# Patient Record
Sex: Male | Born: 1970 | Race: Asian | Hispanic: No | Marital: Married | Smoking: Never smoker
Health system: Southern US, Community
[De-identification: ages and names within clinical notes are randomized; demographics above are authoritative.]

## PROBLEM LIST (undated history)

## (undated) DIAGNOSIS — T7840XA Allergy, unspecified, initial encounter: Secondary | ICD-10-CM

## (undated) HISTORY — DX: Allergy, unspecified, initial encounter: T78.40XA

---

## 2000-10-24 ENCOUNTER — Encounter: Admission: RE | Admit: 2000-10-24 | Discharge: 2000-10-24 | Payer: Self-pay | Admitting: Hematology and Oncology

## 2000-11-03 ENCOUNTER — Encounter: Admission: RE | Admit: 2000-11-03 | Discharge: 2000-11-03 | Payer: Self-pay | Admitting: Internal Medicine

## 2000-11-04 ENCOUNTER — Encounter: Admission: RE | Admit: 2000-11-04 | Discharge: 2000-11-04 | Payer: Self-pay | Admitting: *Deleted

## 2004-03-25 ENCOUNTER — Emergency Department (HOSPITAL_COMMUNITY): Admission: EM | Admit: 2004-03-25 | Discharge: 2004-03-25 | Payer: Self-pay | Admitting: Emergency Medicine

## 2004-10-19 ENCOUNTER — Ambulatory Visit: Payer: Self-pay | Admitting: Gastroenterology

## 2004-10-23 ENCOUNTER — Ambulatory Visit: Payer: Self-pay | Admitting: Gastroenterology

## 2004-10-29 ENCOUNTER — Ambulatory Visit: Payer: Self-pay | Admitting: Gastroenterology

## 2004-11-06 ENCOUNTER — Ambulatory Visit: Payer: Self-pay | Admitting: Gastroenterology

## 2005-07-01 ENCOUNTER — Emergency Department (HOSPITAL_COMMUNITY): Admission: EM | Admit: 2005-07-01 | Discharge: 2005-07-01 | Payer: Self-pay | Admitting: Emergency Medicine

## 2010-11-06 ENCOUNTER — Other Ambulatory Visit: Payer: Self-pay | Admitting: Internal Medicine

## 2010-11-06 DIAGNOSIS — R700 Elevated erythrocyte sedimentation rate: Secondary | ICD-10-CM

## 2010-11-07 ENCOUNTER — Other Ambulatory Visit (HOSPITAL_COMMUNITY): Payer: Self-pay

## 2010-11-07 ENCOUNTER — Other Ambulatory Visit: Payer: Self-pay | Admitting: Internal Medicine

## 2010-11-07 ENCOUNTER — Ambulatory Visit (HOSPITAL_COMMUNITY)
Admission: RE | Admit: 2010-11-07 | Discharge: 2010-11-07 | Disposition: A | Discharge status: Home / Self Care | Payer: Medicaid Other | Source: Ambulatory Visit | Attending: Internal Medicine | Admitting: Internal Medicine

## 2010-11-07 DIAGNOSIS — R700 Elevated erythrocyte sedimentation rate: Secondary | ICD-10-CM

## 2010-11-07 DIAGNOSIS — R51 Headache: Secondary | ICD-10-CM | POA: Insufficient documentation

## 2010-11-07 DIAGNOSIS — J329 Chronic sinusitis, unspecified: Secondary | ICD-10-CM | POA: Insufficient documentation

## 2011-12-31 ENCOUNTER — Ambulatory Visit (INDEPENDENT_AMBULATORY_CARE_PROVIDER_SITE_OTHER): Payer: Self-pay | Admitting: Physician Assistant

## 2011-12-31 VITALS — BP 107/72 | HR 89 | Temp 99.8°F | Resp 18 | Ht 67.0 in | Wt 149.0 lb

## 2011-12-31 DIAGNOSIS — R05 Cough: Secondary | ICD-10-CM

## 2011-12-31 DIAGNOSIS — R509 Fever, unspecified: Secondary | ICD-10-CM

## 2011-12-31 DIAGNOSIS — J111 Influenza due to unidentified influenza virus with other respiratory manifestations: Secondary | ICD-10-CM

## 2011-12-31 LAB — POCT CBC
Granulocyte percent: 57.3 %G (ref 37–80)
MID (cbc): 0.5 (ref 0–0.9)
POC Granulocyte: 2 (ref 2–6.9)
POC LYMPH PERCENT: 29.2 %L (ref 10–50)
Platelet Count, POC: 127 10*3/uL — AB (ref 142–424)
RBC: 4.69 M/uL (ref 4.69–6.13)
RDW, POC: 13.8 %

## 2011-12-31 LAB — POCT INFLUENZA A/B: Influenza A, POC: NEGATIVE

## 2011-12-31 MED ORDER — HYDROCODONE-HOMATROPINE 5-1.5 MG/5ML PO SYRP: ORAL_SOLUTION | ORAL | Status: AC

## 2011-12-31 NOTE — Progress Notes (Signed)
Patient ID: Marc Wade MRN: [id number], DOB: [birth year], 41 y.o. Date of Encounter: 12/31/2011, 10:24 AM  Primary Physician: No primary provider on file.  Chief Complaint:  Chief Complaint  Patient presents with  . Cough    x 5 days; chest pain with cough  . Generalized Body Aches  . Fever    HPI: 41 y.o. year old male presents with 5 day history of nasal congestion, post nasal drip, sore throat, sinus pressure, and cough. Subjective fever and chills. Nasal congestion thick and green/yellow. Cough is mildly productive of yellow sputum and worse in the morning. He is sore from continued coughing. No chest pain, SOB, wheezing, or edema. Ears feel full, leading to sensation of muffled hearing. Has tried OTC cold preps without success. No GI complaints. Appetite normal. No influenza vaccine this year. No known sick contacts.   No recent antibiotics, or recent travels.   No leg trauma, sedentary periods, h/o cancer, or tobacco use.  No past medical history on file.   Home Meds: Prior to Admission medications   Not on File    Allergies: No Known Allergies  History   Social History  . Marital Status: Married    Spouse Name: N/A    Number of Children: N/A  . Years of Education: N/A   Occupational History  . Not on file.   Social History Main Topics  . Smoking status: Never Smoker   . Smokeless tobacco: Not on file  . Alcohol Use: Not on file  . Drug Use: Not on file  . Sexually Active: Not on file   Other Topics Concern  . Not on file   Social History Narrative  . No narrative on file     Review of Systems: Constitutional: negative for night sweats or weight changes Cardiovascular: negative for chest pain or palpitations Respiratory: negative for hemoptysis, wheezing, or shortness of breath Abdominal: negative for abdominal pain, nausea, vomiting or diarrhea Dermatological: negative for rash Neurologic: negative for headache   Physical Exam: Blood pressure  107/72, pulse 89, temperature 99.8 F (37.7 C), resp. rate 18, height 5\' 7"  (1.702 m), weight 149 lb (67.586 kg)., Body mass index is 23.34 kg/(m^2). General: Well developed, well nourished, in no acute distress. Head: Normocephalic, atraumatic, eyes without discharge, sclera non-icteric, nares are congested. Bilateral auditory canals clear, TM's are without perforation, pearly grey with reflective cone of light bilaterally. Serous effusion bilaterally behind TM's. Maxillary sinus TTP. Oral cavity moist, dentition normal. Posterior pharynx with post nasal drip and mild erythema. No peritonsillar abscess or tonsillar exudate. Neck: Supple. No thyromegaly. Full ROM. No lymphadenopathy. Lungs: Clear bilaterally to auscultation without wheezes, rales, or rhonchi. Breathing is unlabored.  Heart: RRR with S1 S2. No murmurs, rubs, or gallops appreciated. Chest soreness associated with cough is reproducible to palpation on exam. Msk:  Strength and tone normal for age. Extremities: No clubbing or cyanosis. No edema. Neuro: Alert and oriented X 3. Moves all extremities spontaneously. CNII-XII grossly in tact. Psych:  Responds to questions appropriately with a normal affect.   Labs: Results for orders placed in visit on 12/31/11  POCT INFLUENZA A/B      Component Value Range   Influenza A, POC Negative     Influenza B, POC Positive    POCT CBC      Component Value Range   WBC 3.5 (*) 4.6 - 10.2 (K/uL)   Lymph, poc 1.0  0.6 - 3.4    POC LYMPH PERCENT 29.2  10 - 50 (%L)   MID (cbc) 0.5  0 - 0.9    POC MID % 13.5 (*) 0 - 12 (%M)   POC Granulocyte 2.0  2 - 6.9    Granulocyte percent 57.3  37 - 80 (%G)   RBC 4.69  4.69 - 6.13 (M/uL)   Hemoglobin 12.8 (*) 14.1 - 18.1 (g/dL)   HCT, POC 16.1 (*) 09.6 - 53.7 (%)   MCV 84.9  80 - 97 (fL)   MCH, POC 27.3  27 - 31.2 (pg)   MCHC 32.2  31.8 - 35.4 (g/dL)   RDW, POC 04.5     Platelet Count, POC 127 (*) 142 - 424 (K/uL)   MPV 9.7  0 - 99.8 (fL)     ASSESSMENT AND PLAN:  41 y.o. year old male with influenza B. -Outside of Tamiflu window -Hycodan #4oz 1 tsp po q 4-6 hours prn cough no RF SED -Mucinex -Tylenol/Motrin prn -Rest/fluids -OOW until 01/06/2012 -RTC precautions -RTC 5 days if no improvement  Signed, Eula Listen, PA-C 12/31/2011 10:24 AM

## 2012-02-10 ENCOUNTER — Ambulatory Visit: Payer: Self-pay | Admitting: Family Medicine

## 2012-02-10 VITALS — BP 109/69 | HR 70 | Temp 98.0°F | Resp 14 | Ht 68.0 in | Wt 149.6 lb

## 2012-02-10 DIAGNOSIS — H1045 Other chronic allergic conjunctivitis: Secondary | ICD-10-CM

## 2012-02-10 DIAGNOSIS — H1010 Acute atopic conjunctivitis, unspecified eye: Secondary | ICD-10-CM

## 2012-02-10 MED ORDER — AZELASTINE HCL 0.05 % OP SOLN: 1.0000 [drp] | Freq: Two times a day (BID) | OPHTHALMIC | Status: AC

## 2012-02-10 NOTE — Progress Notes (Signed)
  [facility]:  Office Visit  Chief Complaint:  Chief Complaint  Patient presents with  . Conjunctivitis    both eyes, itchy, burning, watering    HPI: Marc Wade is a 41 y.o. male who complains of  1 week history of allergic conjunctivitis. He works in Set [employer] I am not sure what but there is smoke involved and it gets in his eyes and it starts burning, he does wear goggles which helps some. Today his eyes were red bilaterally so his manager told him to get it checked out. There is a language barrie, but he has  itchy, burning watery eyes. Denies fevers, swelling, pustular discharge, eye pain, HA or photophobia. He has been scratching them and that makes it feel better.  Tried Alka-Selzter without relief. Has tried OTC saline ? eye drops without relief.   History reviewed. No pertinent past medical history. History reviewed. No pertinent past surgical history. History   Social History  . Marital Status: Married    Spouse Name: N/A    Number of Children: N/A  . Years of Education: N/A   Social History Main Topics  . Smoking status: Never Smoker   . Smokeless tobacco: None  . Alcohol Use: None  . Drug Use: None  . Sexually Active: None   Other Topics Concern  . None   Social History Narrative  . None   No family history on file. No Known Allergies Prior to Admission medications   Not on File     ROS: The patient denies fevers, chills, night sweats, unintentional weight loss, chest pain, palpitations, wheezing, dyspnea on exertion, nausea, vomiting, abdominal pain, dysuria, hematuria, melena, numbness, weakness, or tingling.+ red eyes  All other systems have been reviewed and were otherwise negative with the exception of those mentioned in the HPI and as above.    PHYSICAL EXAM: Filed Vitals:   02/10/12 1004  BP: 109/69  Pulse: 70  Temp: 98 F (36.7 C)  Resp: 14   Filed Vitals:   02/10/12 1004  Height: 5\' 8"  (1.727 m)  Weight: 149 lb  9.6 oz (67.858 kg)   Body mass index is 22.75 kg/(m^2).  General: Alert, no acute distress HEENT:  Normocephalic, atraumatic, oropharynx patent. Fundocopic exam grossly nl, PERRLA, EOMI. Chemosis of sclera bilaterally, watery dc. No swelling. Vision test was fair.  Cardiovascular:  Regular rate and rhythm, no rubs murmurs or gallops.  No Carotid bruits, radial pulse intact. No pedal edema.  Respiratory: Clear to auscultation bilaterally.  No wheezes, rales, or rhonchi.  No cyanosis, no use of accessory musculature GI: No organomegaly, abdomen is soft and non-tender, positive bowel sounds.  No masses. Skin: No rashes. Neurologic: Facial musculature symmetric. Psychiatric: Patient is appropriate throughout our interaction. Lymphatic: No cervical lymphadenopathy Musculoskeletal: Gait intact.   LABS:    EKG/XRAY:   Primary read interpreted by Dr. Conley Rolls at [facility].   ASSESSMENT/PLAN: Encounter Diagnosis  Name Primary?  . Allergic conjunctivitis Yes   Fluroscein staining was normal, showed some Ptergymis bilaterally but no corneal abrasion. Rx: antihistamine opthalmic drops and natural tears, wear goggles at work at all time, avoid irritants, cool compresses.  Patient does not have insurance since just started his new job     Rockne Coons, DO 02/11/2012 11:26 AM

## 2012-02-13 ENCOUNTER — Telehealth: Payer: Self-pay

## 2012-02-13 NOTE — Telephone Encounter (Signed)
Dr.Le Pt states that he is not better. I cannot understand the pt so this is as much information I could get for you.

## 2012-02-13 NOTE — Telephone Encounter (Signed)
Yes, RTC 

## 2012-02-13 NOTE — Telephone Encounter (Signed)
Should patient RTC if not better?

## 2012-02-14 ENCOUNTER — Telehealth: Payer: Self-pay

## 2012-02-14 NOTE — Telephone Encounter (Signed)
LMOM to call back

## 2012-02-14 NOTE — Telephone Encounter (Signed)
Message created in error

## 2012-02-14 NOTE — Telephone Encounter (Signed)
Patient called to get message. Told him to recheck. Patient understood.

## 2012-07-27 ENCOUNTER — Ambulatory Visit (INDEPENDENT_AMBULATORY_CARE_PROVIDER_SITE_OTHER): Payer: BC Managed Care – PPO | Admitting: Family Medicine

## 2012-07-27 VITALS — BP 109/68 | HR 75 | Temp 98.1°F | Resp 18 | Ht 67.0 in | Wt 145.8 lb

## 2012-07-27 DIAGNOSIS — R05 Cough: Secondary | ICD-10-CM

## 2012-07-27 DIAGNOSIS — J029 Acute pharyngitis, unspecified: Secondary | ICD-10-CM

## 2012-07-27 DIAGNOSIS — R091 Pleurisy: Secondary | ICD-10-CM

## 2012-07-27 LAB — POCT CBC
HCT, POC: 49.6 % (ref 43.5–53.7)
Hemoglobin: 15.3 g/dL (ref 14.1–18.1)
Lymph, poc: 1.9 (ref 0.6–3.4)
MCH, POC: 27 pg (ref 27–31.2)
MCHC: 30.8 g/dL — AB (ref 31.8–35.4)
MCV: 87.7 fL (ref 80–97)
POC LYMPH PERCENT: 43.4 %L (ref 10–50)
WBC: 4.4 10*3/uL — AB (ref 4.6–10.2)

## 2012-07-27 MED ORDER — BENZONATATE 100 MG PO CAPS: 200.0000 mg | ORAL_CAPSULE | Freq: Three times a day (TID) | ORAL | Status: DC | PRN

## 2012-07-27 MED ORDER — NAPROXEN 500 MG PO TABS: 500.0000 mg | ORAL_TABLET | Freq: Two times a day (BID) | ORAL | Status: DC

## 2012-07-27 NOTE — Progress Notes (Signed)
  Subjective:    Patient ID: Marc Wade, male    DOB: [birth year], 41 y.o.   MRN: [id number]   Chief Complaint  Patient presents with  . Cough    started having a cough on Saturday and now having pains in the chest when coughing  . Sore Throat    HPI  2d prev dev sore throat and yesterday it worsened and to the point where he couldn't go to work.  Dev dry cough as well. No f/c. Slight HA bitemporal, no sinus, ear, eye, nasal cong.  Does feel SHoB and sternal CP with cough, has only tried tylenol otc.    Review of Systems    BP 109/68  Pulse 75  Temp(Src) 98.1 F (36.7 C) (Oral)  Resp 18  Ht 5\' 7"  (1.702 m)  Wt 145 lb 12.8 oz (66.134 kg)  BMI 22.83 kg/m2  SpO2 99% Objective:   Physical Exam         Results for orders placed in visit on 07/27/12  POCT CBC      Component Value Range   WBC 4.4 (*) 4.6 - 10.2 K/uL   Lymph, poc 1.9  0.6 - 3.4   POC LYMPH PERCENT 43.4  10 - 50 %L   MID (cbc) 0.3  0 - 0.9   POC MID % 7.7  0 - 12 %M   POC Granulocyte 2.2  2 - 6.9   Granulocyte percent 48.9  37 - 80 %G   RBC 5.66  4.69 - 6.13 M/uL   Hemoglobin 15.3  14.1 - 18.1 g/dL   HCT, POC 40.9  81.1 - 53.7 %   MCV 87.7  80 - 97 fL   MCH, POC 27.0  27 - 31.2 pg   MCHC 30.8 (*) 31.8 - 35.4 g/dL   RDW, POC 91.4     Platelet Count, POC 199  142 - 424 K/uL   MPV 9.0  0 - 99.8 fL     Assessment & Plan:  1. Pleuritis - cough medicine to suppress cough so does worsen and nsaids. 2. Pharyngitis - suspect viral - treat with nsaids. Wrote out for work x 2d.  Recheck if not feeling better.  Sore throat - Plan: POCT CBC  Cough - Plan: POCT CBC  Pleuritis  Meds ordered this encounter  Medications  . benzonatate (TESSALON PERLES) 100 MG capsule    Sig: Take 2 capsules (200 mg total) by mouth 3 (three) times daily as needed for cough.    Dispense:  30 capsule    Refill:  0  . naproxen (NAPROSYN) 500 MG tablet    Sig: Take 1 tablet (500 mg total) by mouth 2 (two) times daily with a  meal.    Dispense:  30 tablet    Refill:  0

## 2013-03-08 ENCOUNTER — Ambulatory Visit (INDEPENDENT_AMBULATORY_CARE_PROVIDER_SITE_OTHER): Payer: BC Managed Care – PPO | Admitting: Internal Medicine

## 2013-03-08 ENCOUNTER — Ambulatory Visit: Payer: BC Managed Care – PPO

## 2013-03-08 VITALS — BP 100/60 | HR 54 | Temp 98.1°F | Resp 18 | Ht 67.75 in | Wt 148.0 lb

## 2013-03-08 DIAGNOSIS — M25561 Pain in right knee: Secondary | ICD-10-CM

## 2013-03-08 DIAGNOSIS — M25569 Pain in unspecified knee: Secondary | ICD-10-CM

## 2013-03-08 DIAGNOSIS — F524 Premature ejaculation: Secondary | ICD-10-CM

## 2013-03-08 MED ORDER — MELOXICAM 15 MG PO TABS: 15.0000 mg | ORAL_TABLET | Freq: Every day | ORAL | Status: DC

## 2013-03-08 MED ORDER — PAROXETINE HCL 20 MG PO TABS: 20.0000 mg | ORAL_TABLET | ORAL | Status: DC

## 2013-03-08 NOTE — Patient Instructions (Signed)
Premature ejaculation (PE) - Premature ejaculation (PE) is also referred to as rapid or early ejaculation and is defined according to three essential criteria [51]: (1) brief ejaculatory latency; (2) loss of control; and (3) psychological distress in the patient and/or partner. Ejaculatory latency of about a minute or less may qualify a man for the diagnosis, which should include consistent inability to delay or control ejaculation, and marked distress about the condition. All three components should be present to qualify for the diagnosis. Subtypes of the disorder are symptom-based, including lifelong versus acquired, global versus situational PE, and the co-occurrence of other sexual problems, particularly ED [52,53].  About 30 percent of men with PE have concurrent ED, which typically results in early ejaculation without full erection [54,55]. A wide range of severity is seen, with patients ejaculating on or prior to penetration in the most severe cases. Patients sometimes present for infertility concerns. Normative data on average latency of ejaculation in different age and ethnic groups is limited [54,55].  The etiology of the disorder is uncertain or obscure in most cases. Negative conditioning and penile hypersensitivity may be etiological factors in PE, although neither mechanism has received adequate experimental support to date [52,56]. Similarly, a genetic basis for the disorder has been proposed [57,58] but is lacking adequate scientific support. PE is a highly prevalent disorder [54,55]; however, accurate population-based data are not available. Although usually associated with less bother than ED, the disorder may be highly distressing in some instances [59]. It is frequently associated with sexual problems in the partner, particularly anorgasmia or a sexual pain disorder (eg, vaginismus). In such cases, couples or sex therapy approaches may be of particular value. These may be combined with  pharmacotherapy or behavioral therapy for treatment of PE in the man [51-53]. Cultural factors are important in determining the degree of distress associated with the disorder  Treatment starts with paxil 20 mg daily and should start to work in 10-15 days If no success , referral to urology may be next step Sometimes counseling is required Using a condom will delay ejaculation as well

## 2013-03-08 NOTE — Progress Notes (Signed)
Subjective:    Patient ID: Marc Wade, male    DOB: 06-04-1971, 42 y.o.   MRN: [id number]  HPI complaining of knee pain for about 4 weeks with no known injury No swelling or redness Hurts to walk at times  Also complaining of premature ejaculation Married for 5-7 years This has. been a problem since the start although was better at first His wife has been complaining He is worried about loss of infection He has no other genitourinary symptoms He denies depression and anxiety    Review of Systems No weight loss or night sweats No underlying cardiopulmonary conditions     Objective:   Physical Exam BP 100/60  Pulse 54  Temp(Src) 98.1 F (36.7 C)  Resp 18  Ht 5' 7.75" (1.721 m)  Wt 148 lb (67.132 kg)  BMI 22.67 kg/m2  SpO2 97% Right knee exam is fully within normal limits except for mild tenderness in the popliteal area      [facility] reading (PRIMARY) by  Dr.Doolitle=NAD   Assessment & Plan:  Pain, knee, right - Plan: DG Knee 1-2 Views Right  Premature ejaculation  Meds ordered this encounter  Medications  . PARoxetine (PAXIL) 20 MG tablet    Sig: Take 1 tablet (20 mg total) by mouth every morning.    Dispense:  30 tablet    Refill:  5  . meloxicam (MOBIC) 15 MG tablet    Sig: Take 1 tablet (15 mg total) by mouth daily. For knee pain    Dispense:  30 tablet    Refill:  0   Patient Instructions  Premature ejaculation (PE) - Premature ejaculation (PE) is also referred to as rapid or early ejaculation and is defined according to three essential criteria [51]: (1) brief ejaculatory latency; (2) loss of control; and (3) psychological distress in the patient and/or partner. Ejaculatory latency of about a minute or less may qualify a man for the diagnosis, which should include consistent inability to delay or control ejaculation, and marked distress about the condition. All three components should be present to qualify for the diagnosis. Subtypes of the disorder are  symptom-based, including lifelong versus acquired, global versus situational PE, and the co-occurrence of other sexual problems, particularly ED [52,53].  About 30 percent of men with PE have concurrent ED, which typically results in early ejaculation without full erection [54,55]. A wide range of severity is seen, with patients ejaculating on or prior to penetration in the most severe cases. Patients sometimes present for infertility concerns. Normative data on average latency of ejaculation in different age and ethnic groups is limited [54,55].  The etiology of the disorder is uncertain or obscure in most cases. Negative conditioning and penile hypersensitivity may be etiological factors in PE, although neither mechanism has received adequate experimental support to date [52,56]. Similarly, a genetic basis for the disorder has been proposed [57,58] but is lacking adequate scientific support. PE is a highly prevalent disorder [54,55]; however, accurate population-based data are not available. Although usually associated with less bother than ED, the disorder may be highly distressing in some instances [59]. It is frequently associated with sexual problems in the partner, particularly anorgasmia or a sexual pain disorder (eg, vaginismus). In such cases, couples or sex therapy approaches may be of particular value. These may be combined with pharmacotherapy or behavioral therapy for treatment of PE in the man [51-53]. Cultural factors are important in determining the degree of distress associated with the disorder  Treatment starts with paxil  20 mg daily and should start to work in 10-15 days If no success , referral to urology may be next step Sometimes counseling is required Using a condom will delay ejaculation as well

## 2013-03-10 ENCOUNTER — Ambulatory Visit (INDEPENDENT_AMBULATORY_CARE_PROVIDER_SITE_OTHER): Payer: BC Managed Care – PPO | Admitting: Emergency Medicine

## 2013-03-10 VITALS — BP 100/74 | HR 62 | Temp 98.3°F | Resp 16 | Ht 68.0 in | Wt 151.0 lb

## 2013-03-10 DIAGNOSIS — M25569 Pain in unspecified knee: Secondary | ICD-10-CM

## 2013-03-10 DIAGNOSIS — M25561 Pain in right knee: Secondary | ICD-10-CM

## 2013-03-10 NOTE — Progress Notes (Signed)
  Subjective:    Patient ID: Marc Wade, male    DOB: [birth year], 42 y.o.   MRN: [id number]  HPI patient seen by Dr. Merla Riches last week. He was placed on Mobic for right knee pain. He is an avid jogger. He works doing Administrator, sports. His knee is significantly better. He states he is ready to return to work tomorrow.    Review of Systems     Objective:   Physical Exam Examination of the knee is completely normal. He has no joint line tenderness. He has full flexion and extension. He has normal McMurray testing. He has a negative drawer sign       Assessment & Plan:  Patient given exercises to strengthen his knee. He was advised to change shoes every 6 months. He is to finish out his meloxicam. He can return to work tomorrow.

## 2013-12-05 ENCOUNTER — Ambulatory Visit: Payer: BC Managed Care – PPO

## 2013-12-05 ENCOUNTER — Ambulatory Visit (INDEPENDENT_AMBULATORY_CARE_PROVIDER_SITE_OTHER): Payer: BC Managed Care – PPO | Admitting: Family Medicine

## 2013-12-05 VITALS — BP 94/70 | HR 68 | Temp 98.6°F | Resp 18 | Wt 156.0 lb

## 2013-12-05 DIAGNOSIS — R05 Cough: Secondary | ICD-10-CM

## 2013-12-05 DIAGNOSIS — R7989 Other specified abnormal findings of blood chemistry: Secondary | ICD-10-CM

## 2013-12-05 DIAGNOSIS — J069 Acute upper respiratory infection, unspecified: Secondary | ICD-10-CM

## 2013-12-05 DIAGNOSIS — R059 Cough, unspecified: Secondary | ICD-10-CM

## 2013-12-05 DIAGNOSIS — R6883 Chills (without fever): Secondary | ICD-10-CM

## 2013-12-05 DIAGNOSIS — J209 Acute bronchitis, unspecified: Secondary | ICD-10-CM

## 2013-12-05 DIAGNOSIS — J029 Acute pharyngitis, unspecified: Secondary | ICD-10-CM

## 2013-12-05 LAB — POCT RAPID STREP A (OFFICE): Rapid Strep A Screen: NEGATIVE

## 2013-12-05 LAB — POCT INFLUENZA A/B
Influenza A, POC: NEGATIVE
Influenza B, POC: NEGATIVE

## 2013-12-05 LAB — POCT CBC
Granulocyte percent: 63.8 %G (ref 37–80)
HCT, POC: 42 % — AB (ref 43.5–53.7)
Hemoglobin: 12.9 g/dL — AB (ref 14.1–18.1)
Lymph, poc: 1.5 (ref 0.6–3.4)
MCH, POC: 27.8 pg (ref 27–31.2)
MCHC: 30.7 g/dL — AB (ref 31.8–35.4)
MCV: 90.6 fL (ref 80–97)
MID (cbc): 0.5 (ref 0–0.9)
MPV: 9.6 fL (ref 0–99.8)
POC Granulocyte: 3.5 (ref 2–6.9)
POC LYMPH PERCENT: 27.9 % (ref 10–50)
POC MID %: 8.3 %M (ref 0–12)
Platelet Count, POC: 124 10*3/uL — AB (ref 142–424)
RBC: 4.64 M/uL — AB (ref 4.69–6.13)
RDW, POC: 14.2 %
WBC: 5.5 10*3/uL (ref 4.6–10.2)

## 2013-12-05 MED ORDER — AZITHROMYCIN 250 MG PO TABS: ORAL_TABLET | ORAL | Status: DC

## 2013-12-05 MED ORDER — HYDROCODONE-HOMATROPINE 5-1.5 MG/5ML PO SYRP: 5.0000 mL | ORAL_SOLUTION | Freq: Every evening | ORAL | Status: DC | PRN

## 2013-12-05 MED ORDER — BENZONATATE 100 MG PO CAPS: 200.0000 mg | ORAL_CAPSULE | Freq: Three times a day (TID) | ORAL | Status: DC | PRN

## 2013-12-05 NOTE — Patient Instructions (Signed)

## 2013-12-05 NOTE — Progress Notes (Signed)
Chief Complaint:  Chief Complaint  Patient presents with  . Cough    HPI: Marc Wade is a 43 y.o. male who is here for  3 day history of cough, he has associated chills and msk aches.  Cough worsens when he is out in the cold, he has yellow to dark greenish black sputum.  + Allergies, no asthma.  He states he may have had bronchitis in the past, former but currently nonsmoker. No fevers, + chills, no flu vaccine this year. He had minimal nasal congestiona nd pain, he has no ear pain , he has not trued anything except for tylenol.  He feels the cold may have exacerbated his allergies but not sure. He has denies SOB, wheezing. Denies coughing up blood or night sweats. No history of TB  Past Medical History  Diagnosis Date  . Allergy    History reviewed. No pertinent past surgical history. History   Social History  . Marital Status: Married    Spouse Name: N/A    Number of Children: N/A  . Years of Education: N/A   Social History Main Topics  . Smoking status: Never Smoker   . Smokeless tobacco: None  . Alcohol Use: Yes  . Drug Use: No  . Sexual Activity: None   Other Topics Concern  . None   Social History Narrative  . None   History reviewed. No pertinent family history. No Known Allergies Prior to Admission medications   Medication Sig Start Date End Date Taking? Authorizing Provider  benzonatate (TESSALON PERLES) 100 MG capsule Take 2 capsules (200 mg total) by mouth 3 (three) times daily as needed for cough. 07/27/12   Sherren Mocha, MD  PARoxetine (PAXIL) 20 MG tablet Take 1 tablet (20 mg total) by mouth every morning. 03/08/13   Tonye Pearson, MD     ROS: The patient denies fevers,  night sweats, unintentional weight loss, chest pain, palpitations, wheezing, dyspnea on exertion, nausea, vomiting, abdominal pain, dysuria, hematuria, melena, numbness, weakness, or tingling.   All other systems have been reviewed and were otherwise negative with the  exception of those mentioned in the HPI and as above.    PHYSICAL EXAM: Filed Vitals:   12/05/13 1038  BP: 94/70  Pulse: 68  Temp: 98.6 F (37 C)  Resp: 18   Filed Vitals:   12/05/13 1038  Weight: 156 lb (70.761 kg)   Body mass index is 23.73 kg/(m^2).  General: Alert, no acute distress HEENT:  Normocephalic, atraumatic, oropharynx patent. EOMI, PERRLA Cardiovascular:  Regular rate and rhythm, no rubs murmurs or gallops.  No Carotid bruits, radial pulse intact. No pedal edema.  Respiratory: Clear to auscultation bilaterally.  No wheezes, rales, or rhonchi.  No cyanosis, no use of accessory musculature GI: No organomegaly, abdomen is soft and non-tender, positive bowel sounds.  No masses. Skin: No rashes. Neurologic: Facial musculature symmetric. Psychiatric: Patient is appropriate throughout our interaction. Lymphatic: No cervical lymphadenopathy Musculoskeletal: Gait intact.   LABS: Results for orders placed in visit on 12/05/13  POCT CBC      Result Value Ref Range   WBC 5.5  4.6 - 10.2 K/uL   Lymph, poc 1.5  0.6 - 3.4   POC LYMPH PERCENT 27.9  10 - 50 %L   MID (cbc) 0.5  0 - 0.9   POC MID % 8.3  0 - 12 %M   POC Granulocyte 3.5  2 - 6.9   Granulocyte percent  63.8  37 - 80 %G   RBC 4.64 (*) 4.69 - 6.13 M/uL   Hemoglobin 12.9 (*) 14.1 - 18.1 g/dL   HCT, POC 16.142.0 (*) 09.643.5 - 53.7 %   MCV 90.6  80 - 97 fL   MCH, POC 27.8  27 - 31.2 pg   MCHC 30.7 (*) 31.8 - 35.4 g/dL   RDW, POC 04.514.2     Platelet Count, POC 124 (*) 142 - 424 K/uL   MPV 9.6  0 - 99.8 fL  POCT INFLUENZA A/B      Result Value Ref Range   Influenza A, POC Negative     Influenza B, POC Negative    POCT RAPID STREP A (OFFICE)      Result Value Ref Range   Rapid Strep A Screen Negative  Negative     EKG/XRAY:   [facility] ? Right mid lobe infiltrate vs increase vasc marking  No pneuomothorax, no effusion   ASSESSMENT/PLAN: Encounter Diagnoses  Name Primary?  .  Cough   . Chills   . Acute pharyngitis   . Acute URI Yes  . Abnormal CBC   . Acute bronchitis    Will cover with azithromycin for bronchitis vs PNA Right mid perihilar lobe Hycodan, tessalon perles Will call with official xray results F/u in 1 week for repeat labs, he has an abnormal Hgb and also Plt count, he ahs had this before but it has been several years.  work note given  Gross sideeffects, risk and benefits, and alternatives of medications d/w patient. Patient is aware that all medications have potential sideeffects and we are unable to predict every sideeffect or drug-drug interaction that may occur.  Hamilton CapriLE, Senaya Dicenso PHUONG, DO 12/05/2013 3:33 PM

## 2013-12-12 ENCOUNTER — Ambulatory Visit (INDEPENDENT_AMBULATORY_CARE_PROVIDER_SITE_OTHER): Payer: BC Managed Care – PPO | Admitting: Family Medicine

## 2013-12-12 VITALS — BP 118/64 | HR 63 | Temp 98.7°F | Resp 16 | Ht 68.0 in | Wt 154.4 lb

## 2013-12-12 DIAGNOSIS — R059 Cough, unspecified: Secondary | ICD-10-CM

## 2013-12-12 DIAGNOSIS — R05 Cough: Secondary | ICD-10-CM

## 2013-12-12 DIAGNOSIS — H6590 Unspecified nonsuppurative otitis media, unspecified ear: Secondary | ICD-10-CM

## 2013-12-12 DIAGNOSIS — D649 Anemia, unspecified: Secondary | ICD-10-CM

## 2013-12-12 LAB — POCT CBC
Granulocyte percent: 44.3 %G (ref 37–80)
HEMATOCRIT: 37.3 % — AB (ref 43.5–53.7)
HEMOGLOBIN: 11.4 g/dL — AB (ref 14.1–18.1)
LYMPH, POC: 1.6 (ref 0.6–3.4)
MCH: 26.8 pg — AB (ref 27–31.2)
MCHC: 30.6 g/dL — AB (ref 31.8–35.4)
MCV: 87.8 fL (ref 80–97)
MID (cbc): 0.3 (ref 0–0.9)
MPV: 9.6 fL (ref 0–99.8)
POC GRANULOCYTE: 1.5 — AB (ref 2–6.9)
POC LYMPH %: 47.2 % (ref 10–50)
POC MID %: 8.5 % (ref 0–12)
Platelet Count, POC: 185 10*3/uL (ref 142–424)
RBC: 4.25 M/uL — AB (ref 4.69–6.13)
RDW, POC: 13.9 %
WBC: 3.3 10*3/uL — AB (ref 4.6–10.2)

## 2013-12-12 LAB — IRON AND TIBC
%SAT: 41 % (ref 20–55)
IRON: 100 ug/dL (ref 42–165)
TIBC: 245 ug/dL (ref 215–435)
UIBC: 145 ug/dL (ref 125–400)

## 2013-12-12 MED ORDER — FLUTICASONE PROPIONATE 50 MCG/ACT NA SUSP: 1.0000 | Freq: Every day | NASAL | Status: DC

## 2013-12-12 NOTE — Progress Notes (Addendum)
Subjective:    Patient ID: Marc Wade, male    DOB: 01-14-1971, 43 y.o.   MRN: [id number] Marc Wade, Marc Wade  This chart was scribed for Marc Wade, Marc Wade by Marc Wade, Marc Wade at [facility]. This patient was seen in [facility] and the patient's care was started at 9:55 AM.  HPI HPI Comments: Marc Wade is a 43 y.o. male who presents to the [facility] for a recheck of possible bronchitis and left sided pneumonia diagnosed a week ago. Pt also complains of associated "noise in both ears" and "fulness in the ears" onset four days ago. Pt denies any fever or discharge from the ears. Pt also denies any SOB. Pt states his coughing has improved. Pt states that he has been taking his antibiotics as prescribed.   Pt was seen one week ago by Dr. Conley Rolls and diagnosed with possible bronchitis and left sided pneumonia  which was treated with ZPack,Tessalon and Hycodan.  Pt is also noted for having low hemoglobin with borderline platelets at 124, official CXR report did not indicate infiltrate but some findings of bronchitic changes.   Review of Systems  Constitutional: Negative for fever.  HENT: Positive for tinnitus. Negative for ear discharge and trouble swallowing.   Respiratory: Negative for cough (improved).   Gastrointestinal: Negative for abdominal pain, blood in stool (no dark/tarrry stool. ) and anal bleeding.  Skin: Negative for rash.       Objective:   Physical Exam  Nursing note and vitals reviewed. Constitutional: He is oriented to person, place, and time. He appears well-developed and well-nourished. No distress.  HENT:  Head: Normocephalic and atraumatic.  Right Ear: Tympanic membrane, external ear and ear canal normal.  Left Ear: Tympanic membrane, external ear and ear canal normal.  Nose: No rhinorrhea.  Mouth/Throat: Oropharynx is clear and moist and mucous membranes are normal. No oropharyngeal exudate or posterior oropharyngeal  erythema.  Minimal clear fluid at the base of TM bilaterally, right greater than the left, but no redness and canals are clear.   Eyes: Conjunctivae and EOM are normal. Pupils are equal, round, and reactive to light.  Neck: Neck supple. No tracheal deviation present.  Supple    Cardiovascular: Normal rate, regular rhythm, normal heart sounds and intact distal pulses.   No murmur heard. Pulmonary/Chest: Effort normal and breath sounds normal. No respiratory distress. He has no wheezes. He has no rhonchi. He has no rales.  Abdominal: Soft. There is no tenderness.  Musculoskeletal: Normal range of motion.  Lymphadenopathy:    He has no cervical adenopathy.  Neurological: He is alert and oriented to person, place, and time.  Skin: Skin is warm and dry. No rash noted.  Psychiatric: He has a normal mood and affect. His behavior is normal.    Filed Vitals:   12/12/13 0900  BP: 118/64  Pulse: 63  Temp: 98.7 F (37.1 C)  TempSrc: Oral  Resp: 16  Height: 5\' 8"  (1.727 m)  Weight: 154 lb 6.4 oz (70.035 kg)  SpO2: 99%    Results for orders placed in visit on 12/12/13  POCT CBC      Result Value Ref Range   WBC 3.3 (*) 4.6 - 10.2 K/uL   Lymph, poc 1.6  0.6 - 3.4   POC LYMPH PERCENT 47.2  10 - 50 %L   MID (cbc) 0.3  0 - 0.9   POC MID % 8.5  0 -  12 %M   POC Granulocyte 1.5 (*) 2 - 6.9   Granulocyte percent 44.3  37 - 80 %G   RBC 4.25 (*) 4.69 - 6.Wade M/uL   Hemoglobin 11.4 (*) 14.1 - 18.1 g/dL   HCT, POC 16.1 (*) 09.6 - 53.7 %   MCV 87.8  80 - 97 fL   MCH, POC 26.8 (*) 27 - 31.2 pg   MCHC 30.6 (*) 31.8 - 35.4 g/dL   RDW, POC 04.5     Platelet Count, POC 185  142 - 424 K/uL   MPV 9.6  0 - 99.8 fL      Assessment & Plan:   Marc Wade is a 43 y.o. male Otitis, serous - Plan: fluticasone (FLONASE) 50 MCG/ACT nasal spray.  Trail of flonase, handout below on causes and tx. rtc if persists.   Cough - post infectious cough likely. Improving. Cont sx care, rtc if persists.   Anemia  - Plan: POCT CBC, Iron and TIBC - still borderline HGB. Platelets improved. Check iron panel. DDX includes thallessemia. If any lightheadedness/dizziness, dark or tarry stools or other worsening - rtc.  Repeat CBC in next 6 weeks, iron studies pending.    Meds ordered this encounter  Medications  . fluticasone (FLONASE) 50 MCG/ACT nasal spray    Sig: Place 1-2 sprays into both nostrils daily.    Dispense:  16 g    Refill:  1   Patient Instructions  Nasal spray to help pressure in ears. If not better in 2 weeks - recheck.  Repeat blood test in next 6 weeks. You should receive a call or letter about your lab results within the next week to 10 days.   Return to the clinic or go to the nearest emergency [facility] if any of your symptoms worsen or new symptoms occur.  Serous Otitis Media  Serous otitis media is fluid in the middle ear space. This space contains the bones for hearing and air. Air in the middle ear space helps to transmit sound.  The air gets there through the eustachian tube. This tube goes from the back of the nose (nasopharynx) to the middle ear space. It keeps the pressure in the middle ear the same as the outside world. It also helps to drain fluid from the middle ear space. CAUSES  Serous otitis media occurs when the eustachian tube gets blocked. Blockage can come from:  Ear infections.  Colds and other upper respiratory infections.  Allergies.  Irritants such as cigarette smoke.  Sudden changes in air pressure (such as descending in an airplane).  Enlarged adenoids.  A mass in the nasopharynx. During colds and upper respiratory infections, the middle ear space can become temporarily filled with fluid. This can happen after an ear infection also. Once the infection clears, the fluid will generally drain out of the ear through the eustachian tube. If it does not, then serous otitis media occurs. SIGNS AND SYMPTOMS   Hearing loss.  A feeling of fullness in the ear,  without pain.  Young children may not show any symptoms but may show slight behavioral changes, such as agitation, ear pulling, or crying. DIAGNOSIS  Serous otitis media is diagnosed by an ear exam. Tests may be done to check on the movement of the eardrum. Hearing exams may also be done. TREATMENT  The fluid most often goes away without treatment. If allergy is the cause, allergy treatment may be helpful. Fluid that persists for several months may require minor  surgery. A small tube is placed in the eardrum to:  Drain the fluid.  Restore the air in the middle ear space. In certain situations, antibiotics are used to avoid surgery. Surgery may be done to remove enlarged adenoids (if this is the cause). HOME CARE INSTRUCTIONS   Keep children away from tobacco smoke.  Be sure to keep any follow-up appointments. SEEK MEDICAL CARE IF:   Your hearing is not better in 3 months.  Your hearing is worse.  You have ear pain.  You have drainage from the ear.  You have dizziness.  You have serous otitis media only in one ear or have any bleeding from your nose (epistaxis).  You notice a lump on your neck. MAKE SURE YOU:  Understand these instructions.   Will watch your condition.   Will get help right away if you are not doing well or get worse.  Document Released: 12/14/2003 Document Revised: 05/26/2013 Document Reviewed: 04/20/2013 [facility] Patient Information 2014 [place], [place].        I personally performed the services described in this documentation, which was scribed in my presence. The recorded information has been reviewed and considered, and addended by me as needed.

## 2013-12-12 NOTE — Patient Instructions (Signed)
Nasal spray to help pressure in ears. If not better in 2 weeks - recheck.  Repeat blood test in next 6 weeks. You should receive a call or letter about your lab results within the next week to 10 days.   Return to the clinic or go to the nearest emergency room if any of your symptoms worsen or new symptoms occur.  Serous Otitis Media  Serous otitis media is fluid in the middle ear space. This space contains the bones for hearing and air. Air in the middle ear space helps to transmit sound.  The air gets there through the eustachian tube. This tube goes from the back of the nose (nasopharynx) to the middle ear space. It keeps the pressure in the middle ear the same as the outside world. It also helps to drain fluid from the middle ear space. CAUSES  Serous otitis media occurs when the eustachian tube gets blocked. Blockage can come from:  Ear infections.  Colds and other upper respiratory infections.  Allergies.  Irritants such as cigarette smoke.  Sudden changes in air pressure (such as descending in an airplane).  Enlarged adenoids.  A mass in the nasopharynx. During colds and upper respiratory infections, the middle ear space can become temporarily filled with fluid. This can happen after an ear infection also. Once the infection clears, the fluid will generally drain out of the ear through the eustachian tube. If it does not, then serous otitis media occurs. SIGNS AND SYMPTOMS   Hearing loss.  A feeling of fullness in the ear, without pain.  Young children may not show any symptoms but may show slight behavioral changes, such as agitation, ear pulling, or crying. DIAGNOSIS  Serous otitis media is diagnosed by an ear exam. Tests may be done to check on the movement of the eardrum. Hearing exams may also be done. TREATMENT  The fluid most often goes away without treatment. If allergy is the cause, allergy treatment may be helpful. Fluid that persists for several months may require  minor surgery. A small tube is placed in the eardrum to:  Drain the fluid.  Restore the air in the middle ear space. In certain situations, antibiotics are used to avoid surgery. Surgery may be done to remove enlarged adenoids (if this is the cause). HOME CARE INSTRUCTIONS   Keep children away from tobacco smoke.  Be sure to keep any follow-up appointments. SEEK MEDICAL CARE IF:   Your hearing is not better in 3 months.  Your hearing is worse.  You have ear pain.  You have drainage from the ear.  You have dizziness.  You have serous otitis media only in one ear or have any bleeding from your nose (epistaxis).  You notice a lump on your neck. MAKE SURE YOU:  Understand these instructions.   Will watch your condition.   Will get help right away if you are not doing well or get worse.  Document Released: 12/14/2003 Document Revised: 05/26/2013 Document Reviewed: 04/20/2013 [facility]Care Patient Information 2014 [place], [place].

## 2013-12-20 ENCOUNTER — Other Ambulatory Visit: Payer: Self-pay | Admitting: Family Medicine

## 2013-12-20 DIAGNOSIS — R059 Cough, unspecified: Secondary | ICD-10-CM

## 2013-12-20 DIAGNOSIS — R05 Cough: Secondary | ICD-10-CM

## 2013-12-20 MED ORDER — HYDROCODONE-HOMATROPINE 5-1.5 MG/5ML PO SYRP: 5.0000 mL | ORAL_SOLUTION | Freq: Every evening | ORAL | Status: DC | PRN

## 2013-12-27 ENCOUNTER — Other Ambulatory Visit: Payer: Self-pay | Admitting: Family Medicine

## 2013-12-28 NOTE — Telephone Encounter (Signed)
Do you want to RF or have pt RTC for re-check?

## 2014-07-11 ENCOUNTER — Ambulatory Visit (INDEPENDENT_AMBULATORY_CARE_PROVIDER_SITE_OTHER): Payer: BC Managed Care – PPO | Admitting: Family Medicine

## 2014-07-11 ENCOUNTER — Ambulatory Visit (INDEPENDENT_AMBULATORY_CARE_PROVIDER_SITE_OTHER): Payer: BC Managed Care – PPO

## 2014-07-11 VITALS — BP 116/60 | HR 61 | Temp 97.4°F | Resp 16 | Ht 67.0 in | Wt 147.0 lb

## 2014-07-11 DIAGNOSIS — M545 Low back pain, unspecified: Secondary | ICD-10-CM

## 2014-07-11 DIAGNOSIS — M25551 Pain in right hip: Secondary | ICD-10-CM

## 2014-07-11 DIAGNOSIS — T148 Other injury of unspecified body region: Secondary | ICD-10-CM

## 2014-07-11 DIAGNOSIS — T148XXA Other injury of unspecified body region, initial encounter: Secondary | ICD-10-CM

## 2014-07-11 MED ORDER — MELOXICAM 7.5 MG PO TABS: 7.5000 mg | ORAL_TABLET | Freq: Every day | ORAL | Status: DC

## 2014-07-11 NOTE — Progress Notes (Signed)
Chief Complaint:  Chief Complaint  Patient presents with  . Back Pain  . Leg Pain    HPI: Marc Wade is a 43 y.o. male who is here for leg and back pain for 10 days. Right leg pain with walking. Tylenol and Advil taken for pain with some releif. Does not remember doing anything to cause the pain. Has tried tylenol and advil 2 pills with releif, he has no neuropathy.  He has no problems with sciatica or incontinence. He denies any injuries. He denies any repetitive motion or work that requires a lot of lifting .   Past Medical History  Diagnosis Date  . Allergy    History reviewed. No pertinent past surgical history. History   Social History  . Marital Status: Married    Spouse Name: N/A    Number of Children: N/A  . Years of Education: N/A   Social History Main Topics  . Smoking status: Never Smoker   . Smokeless tobacco: None  . Alcohol Use: Yes  . Drug Use: No  . Sexual Activity: None   Other Topics Concern  . None   Social History Narrative  . None   History reviewed. No pertinent family history. No Known Allergies Prior to Admission medications   Medication Sig Start Date End Date Taking? Authorizing Provider  fluticasone (FLONASE) 50 MCG/ACT nasal spray Place 1-2 sprays into both nostrils daily. 12/12/13  Yes Shade FloodJeffrey R Greene, MD  HYDROcodone-homatropine [facility](HYCODAN) 5-1.5 MG/5ML syrup Take 5 mLs by mouth at bedtime as needed for cough. 12/20/13  Yes Thao P Le, DO  PARoxetine (PAXIL) 20 MG tablet Take 1 tablet (20 mg total) by mouth every morning. 03/08/13  Yes Tonye Pearsonobert P Doolittle, MD     ROS: The patient denies fevers, chills, night sweats, unintentional weight loss, chest pain, palpitations, wheezing, dyspnea on exertion, nausea, vomiting, abdominal pain, dysuria, hematuria, melena, numbness, weakness, or tingling.   All other systems have been reviewed and were otherwise negative with the exception of those mentioned in the HPI and as above.    PHYSICAL  EXAM: Filed Vitals:   07/11/14 1307  BP: 116/60  Pulse: 61  Temp: 97.4 F (36.3 C)  Resp: 16   Filed Vitals:   07/11/14 1307  Height: 5\' 7"  (1.702 m)  Weight: 147 lb (66.679 kg)   Body mass index is 23.02 kg/(m^2).  General: Alert, no acute distress HEENT:  Normocephalic, atraumatic, oropharynx patent. EOMI, PERRLA Cardiovascular:  Regular rate and rhythm, no rubs murmurs or gallops.  No Carotid bruits, radial pulse intact. No pedal edema.  Respiratory: Clear to auscultation bilaterally.  No wheezes, rales, or rhonchi.  No cyanosis, no use of accessory musculature GI: No organomegaly, abdomen is soft and non-tender, positive bowel sounds.  No masses. Skin: No rashes. Neurologic: Facial musculature symmetric. Psychiatric: Patient is appropriate throughout our interaction. Lymphatic: No cervical lymphadenopathy Musculoskeletal: Gait intact. + paramsk tenderness  Full ROM 5/5 strength, 2/2 DTRs No saddle anesthesia Straight leg negative Hip and knee exam--normal    LABS: Results for orders placed in visit on 12/12/13  IRON AND TIBC      Result Value Ref Range   Iron 100  42 - 165 ug/dL   UIBC 161145  096125 - 045400 ug/dL   TIBC 409245  811215 - 914435 ug/dL   %SAT 41  20 - 55 %  POCT CBC      Result Value Ref Range   WBC 3.3 (*)  4.6 - 10.2 K/uL   Lymph, poc 1.6  0.6 - 3.4   POC LYMPH PERCENT 47.2  10 - 50 %L   MID (cbc) 0.3  0 - 0.9   POC MID % 8.5  0 - 12 %M   POC Granulocyte 1.5 (*) 2 - 6.9   Granulocyte percent 44.3  37 - 80 %G   RBC 4.25 (*) 4.69 - 6.13 M/uL   Hemoglobin 11.4 (*) 14.1 - 18.1 g/dL   HCT, POC 16.1 (*) 09.6 - 53.7 %   MCV 87.8  80 - 97 fL   MCH, POC 26.8 (*) 27 - 31.2 pg   MCHC 30.6 (*) 31.8 - 35.4 g/dL   RDW, POC 04.5     Platelet Count, POC 185  142 - 424 K/uL   MPV 9.6  0 - 99.8 fL     EKG/XRAY:   Primary read interpreted by Dr. Conley Rolls at [facility]. Neg fx or dislocation   ASSESSMENT/PLAN: Encounter Diagnoses  Name Primary?  . Midline low back pain  without sciatica Yes  . Right hip pain   . Sprain and strain    Rx Mobic Fu prn  Gross sideeffects, risk and benefits, and alternatives of medications d/w patient. Patient is aware that all medications have potential sideeffects and we are unable to predict every sideeffect or drug-drug interaction that may occur.  LE, THAO PHUONG, DO 07/11/2014 2:48 PM

## 2014-12-20 ENCOUNTER — Ambulatory Visit (INDEPENDENT_AMBULATORY_CARE_PROVIDER_SITE_OTHER): Payer: BLUE CROSS/BLUE SHIELD | Admitting: Internal Medicine

## 2014-12-20 ENCOUNTER — Ambulatory Visit (INDEPENDENT_AMBULATORY_CARE_PROVIDER_SITE_OTHER): Payer: BLUE CROSS/BLUE SHIELD

## 2014-12-20 VITALS — BP 116/72 | HR 58 | Temp 97.7°F | Resp 18 | Ht 68.0 in | Wt 148.0 lb

## 2014-12-20 DIAGNOSIS — M76892 Other specified enthesopathies of left lower limb, excluding foot: Secondary | ICD-10-CM

## 2014-12-20 DIAGNOSIS — M25562 Pain in left knee: Secondary | ICD-10-CM

## 2014-12-20 DIAGNOSIS — M6580 Other synovitis and tenosynovitis, unspecified site: Secondary | ICD-10-CM

## 2014-12-20 MED ORDER — IBUPROFEN 600 MG PO TABS: 600.0000 mg | ORAL_TABLET | Freq: Three times a day (TID) | ORAL | Status: DC | PRN

## 2014-12-20 NOTE — Patient Instructions (Signed)
Patellar Dislocation and Subluxation with Phase I Rehab Injuries to the knee often include kneecap (patellar) dislocation or subluxation. The patella is a V-shaped bone that sits in a groove in the thigh bone (trochlea). A patellar dislocation occurs when the kneecap is displaced from the trochlea, and the joint surfaces are no longer touching. A subluxation is a similar injury, where the kneecap becomes displaced, but the joint surfaces are still touching. Patellar dislocations and subluxations are most common in adolescents and younger adults. SYMPTOMS   Severe pain in the front of the knee when attempting to move the knee.  A feeling of the knee giving way.  Tenderness, swelling, and bruising (contusion) of the knee.  Numbness or paralysis below the dislocation, from pinching, cutting, or pressure on the blood vessels or nerves (uncommon).  Visible deformity, especially if the dislocation of the kneecap occurs towards the outside of the knee.  Lump on the inner knee, which is the end of the inner part of the thigh bone (femur). CAUSES  Patellar dislocations are caused by a force placed on the kneecap, that is strong enough to displace the bone from its proper alignment. Common causes of injury include:  Direct hit (trauma) to the knee.  Twisting or pivoting injury to the lower limb, when the foot is planted on the ground.  Powerful muscle contraction.  Birth defect (congenital abnormality), such as shallow or malformed joint surfaces. RISK INCREASES WITH:  Contact sports (football, rugby, soccer), sports that require jumping and landing (basketball, volleyball), or sports in which cleats are worn on shoes.  People with wide pelvis, knocked knees, shallow or malformed joint surfaces.  Previous knee injury.  Poor strength and flexibility. PREVENTION  Warm up and stretch properly before activity.  Maintain physical fitness:  Strength, flexibility, and  endurance.  Cardiovascular fitness.  For jumping or contact sports, protect the kneecap with supportive devices (elastic bandages, tape, braces, knee sleeves with a hole for the patella and a built-up outer side, or straps to pull the patella inward, or knee pads).  Use cleats of proper length. PROGNOSIS  If treated properly, patellar dislocations and subluxations usually require at least 6 weeks to heal.  RELATED COMPLICATIONS   Associated fracture or joint cartilage injury.  Damage to nearby nerves or major blood vessels (rare).  Longer healing time or recurring dislocation, if activity is resumed too soon.  Excessive bleeding within the knee, due to dislocation.  Kneecap pain and giving way, often due to inadequate or incomplete rehabilitation.  Unstable or arthritic joint, following repeated injury or delayed treatment. TREATMENT Patellar dislocations and subluxations require immediate realigning of the bones (reduction). Realigning is often completed by hand. However, surgery is sometimes needed. After realignment, treatment first involves the use of ice and medicine, to reduce pain and inflammation. Elevating the injured knee above the level of the heart will also help reduce inflammation. Restraining the knee is often needed, to allow for healing, for up to 6 weeks. After restraint, it is important to perform strengthening and stretching exercises to help regain strength and a full range of motion. These exercises may be completed at home or with a therapist. MEDICATION   If pain medicine is needed, nonsteroidal anti-inflammatory medicines (aspirin and ibuprofen), or other minor pain relievers (acetaminophen), are often advised.  Do not take pain medicine for 7 days before surgery.  Prescription pain relievers may be given, if your caregiver thinks they are needed. Use only as directed and only as much as  you need. HEAT AND COLD  Cold treatment (icing) should be applied for 10  to 15 minutes every 2 to 3 hours for inflammation and pain, and immediately after activity that aggravates your symptoms. Use ice packs or an ice massage.  Heat treatment may be used before performing stretching and strengthening activities prescribed by your caregiver, physical therapist, or athletic trainer. Use a heat pack or a warm water soak. SEEK MEDICAL CARE IF:  Pain, tenderness, or swelling gets worse, despite treatment.  You experience pain, numbness, or coldness in the foot.  Blue, gray, or dark color appears in the toenails.  Any of the following signs of infection occur after surgery: fever, increased pain, swelling, redness, drainage of fluids, or bleeding in the affected area.  New, unexplained symptoms develop. (Drugs used in treatment may produce side effects.) EXERCISES PHASE I EXERCISES RANGE OF MOTION (ROM) AND STRETCHING EXERCISES - Patellar Dislocation and Subluxation Phase I  FIRST TIME DISLOCATIONS OR SUBLUXATIONS: If you have dislocated or subluxed your kneecap for the first time, your caregiver may ask you to wear a knee brace for up to 6 weeks after your injury. This brace will keep your knee completely straight so that the healing tissues are not disrupted by your knee movement. Your caregiver may allow some motion at your knee by using a hinged brace or by allowing you to take your brace off for a limited amount of time to gently bend and straighten your knee. If this is the case, closely follow your caregiver's directions, in order to allow the best recovery.   CHRONIC OR REPEATED DISLOCATIONS OR SUBLUXATIONS: If you have chronic or repeated dislocations or subluxations, your caregiver will likely not ask you to use a brace. He or she will likely have you begin gentle range of motion activities, within the range that is comfortable for you.  Once you are allowed to start moving your knee, these are some of the initial exercises that may be included in your  rehabilitation program. Continue to perform them until you see your caregiver again or until your symptoms go away. While completing these exercises, remember:   Restoring tissue flexibility helps normal motion to return to the joints. This allows healthier, less painful movement and activity.  An effective stretch should be held for at least 30 seconds.  A stretch should never be painful. You should only feel a gentle lengthening or release in the stretched tissue. RANGE OF MOTION - Knee Flexion, Active  Lie on your back with both knees straight. (If this causes back discomfort, bend your opposite knee, placing your foot flat on the floor.)  Slowly slide your heel back toward your buttocks until you feel a gentle stretch in the front of your knee or thigh.  Hold for __________ seconds. Slowly slide your heel back to the starting position. Repeat __________ times. Complete this exercise __________ times per day.  RANGE OF MOTION - Knee Flexion and Extension, Active-Assisted  Sit on the edge of a table or chair with your thighs firmly supported. It may be helpful to place a folded towel under the end of your right / left thigh.  Flexion (bending): Place the ankle of your healthy leg on top of the other ankle. Use your healthy leg to gently bend your right / left knee until you feel a mild tension across the top of your knee.  Hold for __________ seconds.  Extension (straightening): Switch your ankles so your right / left leg is on  top. Use your healthy leg to straighten your right / left knee until you feel a mild tension on the backside of your knee.  Hold for __________ seconds. Repeat __________ times. Complete this exercise __________ times per day. STRETCH - Knee Flexion, Supine  Lie on the floor with your right / left heel and foot lightly touching the wall. (Place both feet on the wall if you do not use a door frame.)  Without using any effort, allow gravity to slide your foot  down the wall slowly until you feel a gentle stretch in the front of your right / left knee.  Hold this stretch for __________ seconds. Then return the leg to the starting position, using your healthy leg for help, if needed. Repeat __________ times. Complete this stretch __________ times per day.  STRENGTHENING EXERCISES - Patellar Dislocation and Subluxation Phase I These exercises may help you when beginning to rehabilitate your injury. They may resolve your symptoms with or without further involvement from your physician, physical therapist or athletic trainer. While completing these exercises, remember:   Muscles can gain both the endurance and the strength needed for everyday activities through controlled exercises.  Complete these exercises as instructed by your physician, physical therapist or athletic trainer. Increase the resistance and repetitions only as guided by your caregiver. STRENGTH - Quadriceps, Isometrics  Lie on your back with your right / left leg extended and your opposite knee bent.  Gradually tense the muscles in the front of your right / left thigh. You should see either your kneecap slide up toward your hip or increased dimpling just above the knee. This motion will push the back of the knee down toward the floor, mat, or bed on which you are lying.  Hold the muscle as tight as you can, without increasing your pain, for __________ seconds.  Relax the muscles slowly and completely in between each repetition. Repeat __________ times. Complete this exercise __________ times per day.  STRENGTH - Quadriceps, Straight Leg Raises Quality counts! Watch for signs that the quadriceps muscle is working, to be sure you are strengthening the correct muscles and not "cheating" by substituting with healthier muscles.  Lay on your back with your right / left leg extended and your opposite knee bent.  Tense the muscles in the front of your right / left thigh. You should see either  your kneecap slide up or increased dimpling just above the knee. Your thigh may even shake a bit.  Tighten these muscles even more and raise your leg 4 to 6 inches off the floor. Hold for __________ seconds.  Keeping these muscles tense, lower your leg.  Relax the muscles slowly and completely between each repetition. Repeat __________ times. Complete this exercise __________ times per day.  STRENGTH - Hip Abductors, Straight Leg Raises Be aware of your form throughout the entire exercise, so that you exercise the correct muscles. Poor form means that you are not strengthening the correct muscles.  Lie on your side so that your head, shoulders, knee and hip line up. You may bend your lower knee to help maintain your balance. Your right / left leg should be on top.  Roll your hips slightly forward, so that your hips are stacked directly over each other and your right / left knee is facing forward.  Lift your top leg up 4-6 inches, leading with your heel. Be sure that your foot does not drift forward or that your knee does not roll toward the ceiling.  Hold this position for __________ seconds. You should feel the muscles in your outer hip lifting. (You may not notice this until your leg begins to tire.)  Slowly lower your leg to the starting position. Allow the muscles to fully relax before beginning the next repetition. Repeat __________ times. Complete this exercise __________ times per day.  STRENGTH - Hip Extensors, Straight Leg Raises  Lie on your stomach on a firm surface.  Tense the muscles in your buttocks to lift your right / left leg about 4 inches. If you cannot lift your leg this high without arching your back, place a pillow under your hips.  Keep your knee straight. Hold __________ seconds.  Slowly lower your leg to the starting position and allow it to relax completely before completing the next repetition. Repeat __________ times. Complete this exercise __________ times  per day.  STRENGTH - Hip Adductors, Straight Leg Raises  Lie on your side so that your head, shoulders, knee and hip line up. You may place your upper foot in front, to help maintain your balance. Your right / left leg should be on the bottom.  Roll your hips slightly forward, so that your hips are stacked directly over each other and your right / left knee is facing forward.  Tense the muscles in your inner thigh and lift your bottom leg 4-6 inches. Hold this position for __________ seconds.  Slowly lower your leg to the starting position. Allow the muscles to fully relax before beginning the next repetition. Repeat __________ times. Complete this exercise __________ times per day.  Document Released: 09/23/2005 Document Revised: 12/16/2011 Document Reviewed: 01/05/2009 [employer] Patient Information 2015 [employer]. This information is not intended to replace advice given to you by your health care provider. Make sure you discuss any questions you have with your health care provider.  

## 2014-12-20 NOTE — Progress Notes (Signed)
   Subjective:    Patient ID: Marc Wade, male    DOB: 06/22/1971, 44 y.o.   MRN: [id number]  Knee Pain   This note was scribed by Mila MerryJean Harrell, CMA for Dr. Robert Bellowhris Gabrille Kilbride M.D.  44 year old male complains of left knee pain. He was exercising yesterday and about 9 pm last night his knee started hurting. He has tried Advil with no relief from the pain. He cannot straiten his left leg fully due to the pain. He has had previous knee pain in the right knee in 2014 and was seen by Dr. Merla Richesoolittle. Dr. Perrin MalteseGuest edited. Runs 1.5hrs many times per week. Pain started at end of run and is medial superior anterior knee. Pain with full flexion and extention. No swelling or warmyh.  2014 had same pain right knee. xr normal, full recovery with rest and ice and nsaids.    Review of Systems     Objective:   Physical Exam  Constitutional: He is oriented to person, place, and time. He appears well-developed and well-nourished. No distress.  HENT:  Head: Normocephalic.  Eyes: EOM are normal. Pupils are equal, round, and reactive to light.  Pulmonary/Chest: Effort normal.  Musculoskeletal:       Left knee: He exhibits ecchymosis, bony tenderness and abnormal meniscus. He exhibits normal range of motion, no swelling, no effusion, no deformity, no erythema, normal alignment, no LCL laxity, normal patellar mobility and no MCL laxity. Tenderness found. Medial joint line and patellar tendon tenderness noted. No lateral joint line, no MCL and no LCL tenderness noted.  Neurological: He is alert and oriented to person, place, and time. He exhibits normal muscle tone. Coordination normal.  Skin: No erythema.  Psychiatric: He has a normal mood and affect.   [facility] reading (PRIMARY) by  Dr Perrin MalteseGuest normal knee xr         Assessment & Plan:  Over use knee pain/Probable patellar tendonitis.medial RICE/Motrin 600mg  10d Patellar exercises//Medial lift shoe

## 2015-03-20 ENCOUNTER — Ambulatory Visit (INDEPENDENT_AMBULATORY_CARE_PROVIDER_SITE_OTHER): Payer: BLUE CROSS/BLUE SHIELD | Admitting: Urgent Care

## 2015-03-20 VITALS — BP 100/70 | HR 88 | Temp 98.7°F | Resp 18 | Ht 67.0 in | Wt 149.1 lb

## 2015-03-20 DIAGNOSIS — F4541 Pain disorder exclusively related to psychological factors: Secondary | ICD-10-CM

## 2015-03-20 DIAGNOSIS — F5109 Other insomnia not due to a substance or known physiological condition: Secondary | ICD-10-CM

## 2015-03-20 DIAGNOSIS — F439 Reaction to severe stress, unspecified: Secondary | ICD-10-CM

## 2015-03-20 DIAGNOSIS — Z638 Other specified problems related to primary support group: Secondary | ICD-10-CM

## 2015-03-20 MED ORDER — CYCLOBENZAPRINE HCL 10 MG PO TABS: 10.0000 mg | ORAL_TABLET | Freq: Every day | ORAL | Status: DC

## 2015-03-20 NOTE — Progress Notes (Signed)
    MRN: [id number] DOB: 30-Aug-1971  Subjective:   Marc PALMATEER is a 44 y.o. male presenting for chief complaint of Headache  Reports onset of headache since yesterday, sleeps about 4-5 hours every night, wakes up too early. He has also had intermittent headache x2 in the last week relieved with Advil. Of note, patient admits that he does feel stressed by his family and also has financial stressors. He tries to go to sleep at the same hour each night but always wakes up too early and cannot go back to sleep. He feels like he then starts to worry about not getting enough rest, thinks about his family and eventually just goes to work. He denies any mood symptoms including depression and sadness. He does not feel like he is talking or moving more slowly, agitation, guilt. Patient works as a [employer] certified accountant, admits practicing healthy diet but does not drink water regularly and on some days doesn't drink any at all. Denies smoking cigarettes, has 2-3 beers once every month. Denies any other aggravating or relieving factors, no other questions or concerns.  Marc Wade has a current medication list which includes the following prescription(s): multivitamin. He has No Known Allergies.  Marc Wade  has a past medical history of Allergy. Also  has no past surgical history on file.  ROS As in subjective.  Objective:   Vitals: BP 100/70 mmHg  Pulse 88  Temp(Src) 98.7 F (37.1 C) (Oral)  Resp 18  Ht 5\' 7"  (1.702 m)  Wt 149 lb 2 oz (67.643 kg)  BMI 23.35 kg/m2  SpO2 99%  Wt Readings from Last 3 Encounters:  03/20/15 149 lb 2 oz (67.643 kg)  12/20/14 148 lb (67.132 kg)  07/11/14 147 lb (66.679 kg)   Physical Exam  Constitutional: He is oriented to person, place, and time. He appears well-developed and well-nourished.  HENT:  TM's intact bilaterally, no effusions or erythema. Nares patent, nasal turbinates pink and moist. No sinus tenderness. Oropharynx clear, mucous membranes moist, dentition in good repair.    Eyes: Conjunctivae and EOM are normal. Pupils are equal, round, and reactive to light. Right eye exhibits no discharge. Left eye exhibits no discharge. No scleral icterus.  Neck: Normal range of motion. Neck supple. No thyromegaly present.  Cardiovascular: Normal rate, regular rhythm and intact distal pulses.  Exam reveals no gallop and no friction rub.   No murmur heard. Pulmonary/Chest: No respiratory distress. He has no wheezes. He has no rales.  Musculoskeletal: Normal range of motion. He exhibits no tenderness.  Strength 5/5.  Neurological: He is alert and oriented to person, place, and time. No cranial nerve deficit.  Skin: Skin is warm and dry. No rash noted. No erythema. No pallor.  Psychiatric: He has a normal mood and affect.   Assessment and Plan :   1. Terminal insomnia 2. Stress at home 3. Stress headache - Stable, declined blood work for today. I recommended patient start taking better care of himself including healthy diet and exercise as well as adequate hydration of at least 64 ounces of water a day to avoid dehydration or shock headaches. Recommended he practice good sleep hygiene, for now we'll use Flexeril to help patient with terminal insomnia. Recheck in 4 weeks, consider blood work at that point if he continues to have terminal insomnia.  Wallis Bamberg, PA-C [facility] [phone] 03/20/2015 6:12 PM

## 2015-03-20 NOTE — Patient Instructions (Addendum)
Sleep hygiene - Sleep hygiene refers to actions that tend to improve and maintain good sleep.  ? Sleep as long as necessary to feel rested (usually seven to eight hours for adults) and then get out of bed ? Maintain a regular sleep schedule, particularly a regular wake-up time in the morning ? Try not to force sleep ? Avoid caffeinated beverages after lunch ? Avoid alcohol near bedtime (eg, late afternoon and evening) ? Avoid smoking or other nicotine intake, particularly during the evening ? Adjust the bedroom environment as needed to decrease stimuli (eg, reduce ambient light, turn off the television or radio) ? Avoid prolonged use of light-emitting screens (laptops, tablets, smartphones, ebooks) before bedtime ? Resolve concerns or worries before bedtime ? Exercise regularly for at least 20 minutes, preferably more than four to five hours prior to bedtime ? Avoid daytime naps, especially if they are longer than 20 to 30 minutes or occur late in the day  Keeping you healthy  Get these tests  Blood pressure- Have your blood pressure checked once a year by your healthcare provider.  Normal blood pressure is 120/80.  Weight- Have your body mass index (BMI) calculated to screen for obesity.  BMI is a measure of body fat based on height and weight. You can also calculate your own BMI at https://www.west-esparza.com/.  Cholesterol- Have your cholesterol checked regularly starting at age 28, sooner may be necessary if you have diabetes, high blood pressure, if a family member developed heart diseases at an early age or if you smoke.   Chlamydia, HIV, and other sexual transmitted disease- Get screened each year until the age of 75 then within three months of each new sexual partner.  Diabetes- Have your blood sugar checked regularly if you have high blood pressure, high cholesterol, a family history of diabetes or if you are overweight.  Get these vaccines  Flu shot- Every fall.  Tetanus shot-  Every 10 years.  Menactra- Single dose; prevents meningitis.  Take these steps  Don't smoke- If you do smoke, ask your healthcare provider about quitting. For tips on how to quit, go to www.smokefree.gov or call 1-800-QUIT-NOW.  Be physically active- Exercise 5 days a week for at least 30 minutes.  If you are not already physically active start slow and gradually work up to 30 minutes of moderate physical activity.  Examples of moderate activity include walking briskly, mowing the yard, dancing, swimming bicycling, etc.  Eat a healthy diet- Eat a variety of healthy foods such as fruits, vegetables, low fat milk, low fat cheese, yogurt, lean meats, poultry, fish, beans, tofu, etc.  For more information on healthy eating, go to www.GravelBags.it. Drink plenty of water daily, at least 64 ounces.  Drink alcohol in moderation- Limit alcohol intake two drinks or less a day.  Never drink and drive.  Dentist- Brush and floss teeth twice daily; visit your dentis twice a year.  Depression-Your emotional health is as important as your physical health.  If you're feeling down, losing interest in things you normally enjoy please talk with your healthcare provider.  Gun Safety- If you keep a gun in your home, keep it unloaded and with the safety lock on.  Bullets should be stored separately.  Helmet use- Always wear a helmet when riding a motorcycle, bicycle, rollerblading or skateboarding.  Safe sex- If you may be exposed to a sexually transmitted infection, use a condom  Seat belts- Seat bels can save your life; always wear one.  Smoke/Carbon  Monoxide detectors- These detectors need to be installed on the appropriate level of your home.  Replace batteries at least once a year.  Skin Cancer- When out in the sun, cover up and use sunscreen SPF 15 or higher.  Violence- If anyone is threatening or hurting you, please tell your healthcare provider.

## 2015-10-20 ENCOUNTER — Ambulatory Visit (INDEPENDENT_AMBULATORY_CARE_PROVIDER_SITE_OTHER): Payer: BLUE CROSS/BLUE SHIELD | Admitting: Family Medicine

## 2015-10-20 VITALS — BP 110/68 | HR 74 | Temp 97.9°F | Resp 16 | Ht 68.0 in | Wt 152.0 lb

## 2015-10-20 DIAGNOSIS — B9789 Other viral agents as the cause of diseases classified elsewhere: Principal | ICD-10-CM

## 2015-10-20 DIAGNOSIS — J069 Acute upper respiratory infection, unspecified: Secondary | ICD-10-CM | POA: Diagnosis not present

## 2015-10-20 LAB — POCT INFLUENZA A/B
INFLUENZA A, POC: NEGATIVE
INFLUENZA B, POC: NEGATIVE

## 2015-10-20 MED ORDER — IBUPROFEN 800 MG PO TABS: 800.0000 mg | ORAL_TABLET | Freq: Three times a day (TID) | ORAL | Status: DC | PRN

## 2015-10-20 MED ORDER — HYDROCODONE-HOMATROPINE 5-1.5 MG/5ML PO SYRP: 5.0000 mL | ORAL_SOLUTION | Freq: Three times a day (TID) | ORAL | Status: DC | PRN

## 2015-10-20 NOTE — Patient Instructions (Signed)
Rest and drink plenty of fluids Use the ibuprofen as needed for headache and other pains.  Use the cough syrup as needed for cough- however remember that the cough syrup will make you sleepy so do not use it when you need to drive or work.    Please let me know if you are not feeling better soon- Sooner if worse.    when you are taking the ibuprofen you do not need to use the over the counter advil.

## 2015-10-20 NOTE — Progress Notes (Signed)
[facility] [address] [place] [address] [phone]  Date:  10/20/2015   Name:  Marc Wade   DOB:  1970-10-22   MRN:  [id number]  PCP:  Rockne Coons, DO    Chief Complaint: Sore Throat; Headache; Cough; and Sinusitis   History of Present Illness:  Marc Wade is a 45 y.o. very pleasant male patient who presents with the following:  Generally healthy man here today with complaint of illness for 3 days.  He has noted cough, yellow discharge from his nose, headache and malaise.   He has tried taking halls for his ST and cough.   He is taking advil at night for headache.  He has noted a ST.  He has felt hot and cold.   No GI symptoms.  He did not take any advil so far today He went to work today and ate halls all day.  He works 7 days a week and states that if he is out sick he gets a "point" against him, even with a doctors excuse  There are no active problems to display for this patient.   Past Medical History  Diagnosis Date  . Allergy     History reviewed. No pertinent past surgical history.  Social History  Substance Use Topics  . Smoking status: Never Smoker   . Smokeless tobacco: None  . Alcohol Use: 0.0 oz/week    0 Standard drinks or equivalent per week    History reviewed. No pertinent family history.  No Known Allergies  Medication list has been reviewed and updated.  Current Outpatient Prescriptions on File Prior to Visit  Medication Sig Dispense Refill  . cyclobenzaprine (FLEXERIL) 10 MG tablet Take 1 tablet (10 mg total) by mouth at bedtime. (Patient not taking: Reported on 10/20/2015) 30 tablet 1  . Multiple Vitamin (MULTIVITAMIN) tablet Take 1 tablet by mouth daily. Reported on 10/20/2015     No current facility-administered medications on file prior to visit.    Review of Systems:  As per HPI- otherwise negative.   Physical Examination: Filed Vitals:   10/20/15 1800  BP: 110/68  Pulse: 74  Temp: 97.9 F  (36.6 C)  Resp: 16   Filed Vitals:   10/20/15 1800  Height: 5\' 8"  (1.727 m)  Weight: 152 lb (68.947 kg)   Body mass index is 23.12 kg/(m^2). Ideal Body Weight: Weight in (lb) to have BMI = 25: 164.1  GEN: WDWN, NAD, Non-toxic, A & O x 3, not acutely ill appearing but also does not appear to feel great.   HEENT: Atraumatic, Normocephalic. Neck supple. No masses, No LAD.  Bilateral TM wnl, oropharynx normal.  PEERL,EOMI.  Nasal cavity is inflamed  Ears and Nose: No external deformity. CV: RRR, No M/G/R. No JVD. No thrill. No extra heart sounds. PULM: CTA B, no wheezes, crackles, rhonchi. No retractions. No resp. distress. No accessory muscle use. EXTR: No c/c/e NEURO Normal gait.  PSYCH: Normally interactive. Conversant. Not depressed or anxious appearing.  Calm demeanor.   Results for orders placed or performed in visit on 10/20/15  POCT Influenza A/B  Result Value Ref Range   Influenza A, POC Negative Negative   Influenza B, POC Negative Negative   Assessment and Plan: Viral URI with cough - Plan: POCT Influenza A/B, HYDROcodone-homatropine (HYCODAN) 5-1.5 MG/5ML syrup, ibuprofen (ADVIL,MOTRIN) 800 MG tablet  Here today with an illness- possible flu although test is negative and he has been sick for  over 72 hours now so will not rx tamiflu Encouraged him to stay home and rest if possible this weekend- did give him a note in case it may help Ibuprofen 800 and hycodan rx Close follow-up if not feeling better  Meds ordered this encounter  Medications  . HYDROcodone-homatropine (HYCODAN) 5-1.5 MG/5ML syrup    Sig: Take 5 mLs by mouth every 8 (eight) hours as needed for cough.    Dispense:  90 mL    Refill:  0  . ibuprofen (ADVIL,MOTRIN) 800 MG tablet    Sig: Take 1 tablet (800 mg total) by mouth every 8 (eight) hours as needed.    Dispense:  30 tablet    Refill:  0     Signed Abbe AmsterdamJessica Adilynn Bessey, MD

## 2016-04-30 ENCOUNTER — Ambulatory Visit: Payer: BLUE CROSS/BLUE SHIELD

## 2017-03-24 ENCOUNTER — Encounter: Payer: Self-pay | Admitting: Physician Assistant

## 2017-03-24 ENCOUNTER — Ambulatory Visit (INDEPENDENT_AMBULATORY_CARE_PROVIDER_SITE_OTHER): Payer: BLUE CROSS/BLUE SHIELD | Admitting: Physician Assistant

## 2017-03-24 VITALS — BP 108/67 | HR 60 | Temp 98.3°F | Resp 16 | Ht 68.0 in | Wt 155.8 lb

## 2017-03-24 DIAGNOSIS — Z658 Other specified problems related to psychosocial circumstances: Secondary | ICD-10-CM

## 2017-03-24 DIAGNOSIS — IMO0002 Reserved for concepts with insufficient information to code with codable children: Secondary | ICD-10-CM

## 2017-03-24 MED ORDER — PAROXETINE HCL 10 MG PO TABS: 5.0000 mg | ORAL_TABLET | Freq: Every day | ORAL | 11 refills | Status: DC

## 2017-03-24 NOTE — Progress Notes (Signed)
    03/25/2017 5:14 PM   DOB: 03/03/1971 / MRN: [id number]  SUBJECTIVE:  Marc Wade is a 46 y.o. male presenting for "headache."  However once I get in the room he denies HA and tells me that he has had problems with "going too fast" during sex for as long as he can remember.  He wife came forward about 3 weeks ago and told him that she was tired of this and told him he should seek medical care. He spoke to a friend about this who also encouraged him to come in. He is not comfortable talking about this.   He has No Known Allergies.   He  has a past medical history of Allergy.    He  reports that he has never smoked. He has never used smokeless tobacco. He reports that he drinks alcohol. He reports that he does not use drugs. He  has no sexual activity history on file. The patient  has no past surgical history on file.  His family history is not on file.  Review of Systems  Cardiovascular: Negative for chest pain.  Skin: Negative for rash.  Neurological: Negative for dizziness.  Psychiatric/Behavioral: Negative for depression.    The problem list and medications were reviewed and updated by myself where necessary and exist elsewhere in the encounter.   OBJECTIVE:  BP 108/67 (BP Location: Right Arm, Patient Position: Sitting, Cuff Size: Normal)   Pulse 60   Temp 98.3 F (36.8 C) (Oral)   Resp 16   Ht 5\' 8"  (1.727 m)   Wt 155 lb 12.8 oz (70.7 kg)   SpO2 99%   BMI 23.69 kg/m   Physical Exam  Constitutional: He appears well-developed. He is active and cooperative.  Non-toxic appearance.  Cardiovascular: Normal rate.   Pulmonary/Chest: Effort normal. No tachypnea.  Neurological: He is alert.  Skin: Skin is warm and dry. He is not diaphoretic. No pallor.  Vitals reviewed.   No results found for this or any previous visit (from the past 72 hour(s)).  No results found.  ASSESSMENT AND PLAN:  Marc Wade was seen today for premature ejaculation.  Diagnoses and all orders for this  visit:  Personal problem: No need for workup as he always been this way.  Addvised that he titrate the dose to his satisfaction but to do this on a weekly basis.   -     PARoxetine (PAXIL) 10 MG tablet; Take 0.5-1.5 tablets (5-15 mg total) by mouth daily. Take the lowest effective dose.    The patient is advised to call or return to clinic if he does not see an improvement in symptoms, or to seek the care of the closest emergency department if he worsens with the above plan. Neta Ehlers Raniah Karan, MHS, PA-C Primary Care at [facility] 03/25/2017 5:14 PM

## 2017-03-24 NOTE — Patient Instructions (Addendum)
  Start with five mg at 9 pm every night.  If this works well then continue that dose.  If not satisfied increase to 10 mg and continue for a week.  If still not satisfied then go up to the 15 mg.  If that is still not working then come back so we can talk.    IF you received an x-ray today, you will receive an invoice from [facility]. Please contact [facility] at [phone] with questions or concerns regarding your invoice.   IF you received labwork today, you will receive an invoice from [facility]. Please contact [facility] at [phone] with questions or concerns regarding your invoice.   Our billing staff will not be able to assist you with questions regarding bills from these companies.  You will be contacted with the lab results as soon as they are available. The fastest way to get your results is to activate your My Chart account. Instructions are located on the last page of this paperwork. If you have not heard from us regarding the results in 2 weeks, please contact this office.

## 2017-05-05 ENCOUNTER — Ambulatory Visit (INDEPENDENT_AMBULATORY_CARE_PROVIDER_SITE_OTHER): Payer: BLUE CROSS/BLUE SHIELD | Admitting: Physician Assistant

## 2017-05-05 ENCOUNTER — Encounter: Payer: Self-pay | Admitting: Physician Assistant

## 2017-05-05 VITALS — BP 102/63 | HR 66 | Resp 16 | Ht 68.0 in | Wt 151.4 lb

## 2017-05-05 DIAGNOSIS — H10023 Other mucopurulent conjunctivitis, bilateral: Secondary | ICD-10-CM | POA: Diagnosis not present

## 2017-05-05 MED ORDER — POLYMYXIN B-TRIMETHOPRIM 10000-0.1 UNIT/ML-% OP SOLN: 1.0000 [drp] | Freq: Four times a day (QID) | OPHTHALMIC | 0 refills | Status: DC

## 2017-05-05 NOTE — Progress Notes (Signed)
    05/06/2017 9:15 AM   DOB: 04/18/1971 / MRN: [id number]  SUBJECTIVE:  Marc Wade is a 46 y.o. male presenting for bilateral eye tearing and crusting that started 3 days ago after his 48 year old son hand similar symptoms which resolved 3 days ago.  Associates itching and denies changes in vision and eye pain.   He has No Known Allergies.   He  has a past medical history of Allergy.    He  reports that he has never smoked. He has never used smokeless tobacco. He reports that he drinks alcohol. He reports that he does not use drugs. He  has no sexual activity history on file. The patient  has no past surgical history on file.  His family history is not on file.  Review of Systems  Constitutional: Negative for chills and fever.  Eyes: Positive for discharge and redness. Negative for blurred vision, double vision, photophobia and pain.  Gastrointestinal: Negative for nausea.  Skin: Negative for rash.  Neurological: Negative for dizziness.    The problem list and medications were reviewed and updated by myself where necessary and exist elsewhere in the encounter.   OBJECTIVE:  BP 102/63 (BP Location: Right Arm, Patient Position: Sitting, Cuff Size: Normal)   Pulse 66   Resp 16   Ht 5\' 8"  (1.727 m)   Wt 151 lb 6.4 oz (68.7 kg)   SpO2 97%   BMI 23.02 kg/m   Physical Exam  Constitutional: He appears well-developed. He is active and cooperative.  Non-toxic appearance.  Eyes: Right eye exhibits discharge and exudate. Left eye exhibits discharge and exudate. Right conjunctiva is not injected. Right conjunctiva has no hemorrhage. Left conjunctiva is not injected. Left conjunctiva has no hemorrhage. Right eye exhibits normal extraocular motion and no nystagmus. Left eye exhibits normal extraocular motion and no nystagmus. Right pupil is round and reactive. Left pupil is round and reactive. Pupils are equal.  Cardiovascular: Normal rate.   Pulmonary/Chest: Effort normal. No tachypnea.    Lymphadenopathy:       Head (right side): No preauricular adenopathy present.       Head (left side): Preauricular adenopathy present.  Neurological: He is alert.  Skin: Skin is warm and dry. He is not diaphoretic. No pallor.  Vitals reviewed.   No results found for this or any previous visit (from the past 72 hour(s)).  No results found.  ASSESSMENT AND PLAN:  Marc Wade was seen today for eye drainage.  Diagnoses and all orders for this visit:  Pink eye disease of both eyes: Given copious pus bilaterally likely bacterial.  Will treat.  Advised warm compress.  RTC PRN.  -     trimethoprim-polymyxin b (POLYTRIM) ophthalmic solution; Place 1 drop into both eyes every 6 (six) hours.    The patient is advised to call or return to clinic if he does not see an improvement in symptoms, or to seek the care of the closest emergency department if he worsens with the above plan.   Deliah BostonMichael Caulder Wehner, MHS, PA-C Primary Care at [facility] 05/06/2017 9:15 AM

## 2017-05-05 NOTE — Patient Instructions (Signed)
     IF you received an x-ray today, you will receive an invoice from [facility]. Please contact [facility] at [phone] with questions or concerns regarding your invoice.   IF you received labwork today, you will receive an invoice from [facility]. Please contact [facility] at [phone] with questions or concerns regarding your invoice.   Our billing staff will not be able to assist you with questions regarding bills from these companies.  You will be contacted with the lab results as soon as they are available. The fastest way to get your results is to activate your My Chart account. Instructions are located on the last page of this paperwork. If you have not heard from us regarding the results in 2 weeks, please contact this office.     

## 2017-05-10 ENCOUNTER — Ambulatory Visit: Payer: BLUE CROSS/BLUE SHIELD | Admitting: Physician Assistant

## 2017-07-07 ENCOUNTER — Ambulatory Visit: Payer: BLUE CROSS/BLUE SHIELD | Admitting: Physician Assistant

## 2018-01-05 ENCOUNTER — Encounter: Payer: Self-pay | Admitting: Physician Assistant

## 2018-10-21 ENCOUNTER — Ambulatory Visit: Payer: BLUE CROSS/BLUE SHIELD | Admitting: Emergency Medicine

## 2019-10-10 ENCOUNTER — Other Ambulatory Visit: Payer: Self-pay

## 2019-10-10 ENCOUNTER — Encounter (HOSPITAL_COMMUNITY): Payer: Self-pay

## 2019-10-10 ENCOUNTER — Ambulatory Visit (INDEPENDENT_AMBULATORY_CARE_PROVIDER_SITE_OTHER): Payer: BLUE CROSS/BLUE SHIELD

## 2019-10-10 ENCOUNTER — Ambulatory Visit (HOSPITAL_COMMUNITY)
Admission: EM | Admit: 2019-10-10 | Discharge: 2019-10-10 | Disposition: A | Discharge status: Home / Self Care | Payer: BLUE CROSS/BLUE SHIELD | Attending: Family Medicine | Admitting: Family Medicine

## 2019-10-10 DIAGNOSIS — U071 COVID-19: Secondary | ICD-10-CM | POA: Diagnosis not present

## 2019-10-10 DIAGNOSIS — R05 Cough: Secondary | ICD-10-CM | POA: Diagnosis not present

## 2019-10-10 DIAGNOSIS — Z20822 Contact with and (suspected) exposure to covid-19: Secondary | ICD-10-CM | POA: Diagnosis not present

## 2019-10-10 DIAGNOSIS — R058 Other specified cough: Secondary | ICD-10-CM

## 2019-10-10 MED ORDER — PROMETHAZINE HCL 6.25 MG/5ML PO SYRP: 6.2500 mg | ORAL_SOLUTION | Freq: Four times a day (QID) | ORAL | 0 refills | Status: DC | PRN

## 2019-10-10 MED ORDER — CETIRIZINE HCL 10 MG PO TABS: 10.0000 mg | ORAL_TABLET | Freq: Every day | ORAL | 11 refills | Status: DC

## 2019-10-10 NOTE — Discharge Instructions (Addendum)
Take promethazine 5 ml every 6 hours as needed for cough. I also recommend cetirizine 10 mg once daily to reduce environmental allergens which could be contributing to persistent cough. Your x-ray is normal and did not show a pneumonia. If shortness of breath develops or cough worsens return here for follow-up.

## 2019-10-10 NOTE — ED Triage Notes (Signed)
Patient presents to Urgent Care with complaints of cough since 5 days ago. Patient reports his work sent him for covid testing today because of his persistent cough.

## 2019-10-10 NOTE — ED Provider Notes (Signed)
[place]    CSN: [id number] Arrival date & time: 10/10/19  1325      History   Chief Complaint Chief Complaint  Patient presents with  . Cough    HPI Marc Wade is a 49 y.o. male.   HPI  Patient reports cough x 5 day which has been productive for 4 days. Phlegm initially green and now color white. Mild shortness of breath with laying down. Cough onset was suddenly and no known prior history of asthma or COPD. No known prior history of PNA. He did not receive a influenza vaccination this year. He has remained afebrile.He presents today after having multiple coughing spells at work and his supervisor recommended that he follow-up here today at urgent care in order to be permitted to return to work.  Patient is being tested for COVID here today.  Past Medical History:  Diagnosis Date  . Allergy     There are no problems to display for this patient.   History reviewed. No pertinent surgical history.     Home Medications    Prior to Admission medications   Medication Sig Start Date End Date Taking? Authorizing Provider  ibuprofen (ADVIL,MOTRIN) 800 MG tablet Take 1 tablet (800 mg total) by mouth every 8 (eight) hours as needed. 10/20/15  Yes Copland, Gay Filler, MD  Multiple Vitamin (MULTIVITAMIN) tablet Take 1 tablet by mouth daily. Reported on 10/20/2015    [provider]  PARoxetine (PAXIL) 10 MG tablet Take 0.5-1.5 tablets (5-15 mg total) by mouth daily. Take the lowest effective dose. 03/24/17   Tereasa Coop, PA-C  trimethoprim-polymyxin b (POLYTRIM) ophthalmic solution Place 1 drop into both eyes every 6 (six) hours. 05/05/17   Tereasa Coop, PA-C    Family History Family History  Problem Relation Age of Onset  . Healthy Mother   . Healthy Father     Social History Social History   Tobacco Use  . Smoking status: Never Smoker  . Smokeless tobacco: Never Used  Substance Use Topics  . Alcohol use: Yes    Alcohol/week: 0.0 standard  drinks    Comment: occ  . Drug use: No     Allergies   Patient has no known allergies.   Review of Systems Review of Systems Pertinent negatives listed in HPI  Physical Exam Triage Vital Signs ED Triage Vitals  Enc Vitals Group     BP 10/10/19 1559 103/65     Pulse Rate 10/10/19 1559 74     Resp 10/10/19 1559 16     Temp 10/10/19 1559 98.6 F (37 C)     Temp Source 10/10/19 1559 Oral     SpO2 10/10/19 1559 96 %     Weight --      Height --      Head Circumference --      Peak Flow --      Pain Score 10/10/19 1558 0     Pain Loc --      Pain Edu? --      Excl. in [place]? --    No data found.  Updated Vital Signs BP 103/65 (BP Location: Left Arm)   Pulse 74   Temp 98.6 F (37 C) (Oral)   Resp 16   SpO2 96%   Visual Acuity Right Eye Distance:   Left Eye Distance:   Bilateral Distance:    Right Eye Near:   Left Eye Near:    Bilateral Near:  Physical Exam Constitutional:      Appearance: He is ill-appearing.  HENT:     Head: Normocephalic.  Cardiovascular:     Rate and Rhythm: Normal rate and regular rhythm.  Pulmonary:     Effort: Pulmonary effort is normal.     Breath sounds: Normal breath sounds.     Comments: Persistent dry hacking cough Chest:     Chest wall: Tenderness present.  Musculoskeletal:        General: Normal range of motion.  Skin:    General: Skin is warm.  Neurological:     General: No focal deficit present.     Mental Status: He is alert.      UC Treatments / Results  Labs (all labs ordered are listed, but only abnormal results are displayed) Labs Reviewed  NOVEL CORONAVIRUS, NAA (HOSP ORDER, SEND-OUT TO REF LAB; TAT 18-24 HRS)    EKG   Radiology No results found.  Procedures Procedures (including critical care time)  Medications Ordered in UC Medications - No data to display  Initial Impression / Assessment and Plan / UC Course  I have reviewed the triage vital signs and the nursing notes.  Pertinent labs  & imaging results that were available during my care of the patient were reviewed by me and considered in my medical decision making (see chart for details).    Acute onset dry cough occasionally productive persistent over the last 5 days.  Patient sent home today from work and has to have a negative Covid test to return to work.  COVID-19 testing pending.  Due to exam findings will obtain a chest x-ray to rule out pneumonia.  Chest x-ray unremarkable today.  Red flags discussed with patient.  Work note given for patient for 72 hours to allow time for test results.  Be available.  Patient verbalized understanding and agreement with plan.  [place] interpreter used throughout the visit and during discharge to facilitate communication. Final Clinical Impressions(s) / UC Diagnoses   Final diagnoses:  Encounter for laboratory testing for COVID-19 virus  Productive cough     Discharge Instructions     Take promethazine 5 ml every 6 hours as needed for cough. I also recommend cetirizine 10 mg once daily to reduce environmental allergens which could be contributing to persistent cough. Your x-ray is normal and did not show a pneumonia. If shortness of breath develops or cough worsens return here for follow-up.    ED Prescriptions    Medication Sig Dispense Auth. Provider   promethazine (PHENERGAN) 6.25 MG/5ML syrup Take 5 mLs (6.25 mg total) by mouth every 6 (six) hours as needed (Cough). 120 mL Bing Neighbors, FNP   cetirizine (ZYRTEC) 10 MG tablet Take 1 tablet (10 mg total) by mouth daily. 30 tablet Bing Neighbors, FNP     PDMP not reviewed this encounter.   Bing Neighbors, FNP 10/11/19 2314

## 2019-10-11 LAB — NOVEL CORONAVIRUS, NAA (HOSP ORDER, SEND-OUT TO REF LAB; TAT 18-24 HRS): SARS-CoV-2, NAA: DETECTED — AB

## 2019-10-12 ENCOUNTER — Telehealth: Payer: Self-pay | Admitting: Emergency Medicine

## 2019-10-12 NOTE — Telephone Encounter (Signed)
With vietnamese interpreter, Your test for COVID-19 was positive, meaning that you were infected with the novel coronavirus and could give the germ to others.  Please continue isolation at home for at least 10 days since the start of your symptoms. If you do not have symptoms, please isolate at home for 10 days from the day you were tested. Once you complete your 10 day quarantine, you may return to normal activities as long as you've not had a fever for over 24 hours(without taking fever reducing medicine) and your symptoms are improving. Please continue good preventive care measures, including:  frequent hand-washing, avoid touching your face, cover coughs/sneezes, stay out of crowds and keep a 6 foot distance from others.  Go to the nearest hospital emergency room if fever/cough/breathlessness are severe or illness seems like a threat to life.  Patient contacted by phone and made aware of    results. Pt verbalized understanding and had all questions answered.

## 2019-10-18 ENCOUNTER — Telehealth (HOSPITAL_COMMUNITY): Payer: Self-pay | Admitting: Internal Medicine

## 2019-10-18 MED ORDER — PROMETHAZINE-DM 6.25-15 MG/5ML PO SYRP: 5.0000 mL | ORAL_SOLUTION | Freq: Four times a day (QID) | ORAL | 0 refills | Status: DC | PRN

## 2019-10-21 ENCOUNTER — Other Ambulatory Visit: Payer: Self-pay

## 2019-10-21 ENCOUNTER — Ambulatory Visit (HOSPITAL_COMMUNITY)
Admission: EM | Admit: 2019-10-21 | Discharge: 2019-10-21 | Disposition: A | Discharge status: Home / Self Care | Payer: BLUE CROSS/BLUE SHIELD | Attending: Internal Medicine | Admitting: Internal Medicine

## 2019-10-21 ENCOUNTER — Encounter (HOSPITAL_COMMUNITY): Payer: Self-pay | Admitting: Emergency Medicine

## 2019-10-21 DIAGNOSIS — U071 COVID-19: Secondary | ICD-10-CM

## 2019-10-21 DIAGNOSIS — Z8616 Personal history of COVID-19: Secondary | ICD-10-CM | POA: Diagnosis not present

## 2019-10-21 DIAGNOSIS — Z0289 Encounter for other administrative examinations: Secondary | ICD-10-CM | POA: Diagnosis not present

## 2019-10-21 NOTE — ED Triage Notes (Addendum)
Pt states he was told to follow up 14 days after he tested positive and he needs a note to return back to work.  Pt tested positive for Covid on Jan. 3.  He denies any symptoms at this time and no fever for at least the last three days without medication.  [place] interpreter used: Carollee Herter [phone]

## 2019-10-22 NOTE — ED Provider Notes (Signed)
[place]    CSN: [id number] Arrival date & time: 10/21/19  0805      History   Chief Complaint Chief Complaint  Patient presents with  . work note  . Follow-up    HPI Marc Wade is a 49 y.o. male is to urgent care for her work note to return to work.  Patient was diagnosed with COVID-19 on 10/10/2019.  Patient has recovered.  He is currently met the CDC criteria for ending quarantine.  Patient is here for a work note.  HPI  Past Medical History:  Diagnosis Date  . Allergy     There are no problems to display for this patient.   History reviewed. No pertinent surgical history.     Home Medications    Prior to Admission medications   Medication Sig Start Date End Date Taking? Authorizing Provider  cetirizine (ZYRTEC) 10 MG tablet Take 1 tablet (10 mg total) by mouth daily. 10/10/19  Yes Scot Jun, FNP  ibuprofen (ADVIL,MOTRIN) 800 MG tablet Take 1 tablet (800 mg total) by mouth every 8 (eight) hours as needed. 10/20/15  Yes Copland, Gay Filler, MD  promethazine (PHENERGAN) 6.25 MG/5ML syrup Take 5 mLs (6.25 mg total) by mouth every 6 (six) hours as needed (Cough). 10/10/19  Yes Scot Jun, FNP  promethazine-dextromethorphan (PROMETHAZINE-DM) 6.25-15 MG/5ML syrup Take 5 mLs by mouth 4 (four) times daily as needed for cough. 10/18/19  Yes Dastan Krider, Myrene Galas, MD  Multiple Vitamin (MULTIVITAMIN) tablet Take 1 tablet by mouth daily. Reported on 10/20/2015    [provider]  PARoxetine (PAXIL) 10 MG tablet Take 0.5-1.5 tablets (5-15 mg total) by mouth daily. Take the lowest effective dose. 03/24/17   Tereasa Coop, PA-C  trimethoprim-polymyxin b (POLYTRIM) ophthalmic solution Place 1 drop into both eyes every 6 (six) hours. 05/05/17   Tereasa Coop, PA-C    Family History Family History  Problem Relation Age of Onset  . Healthy Mother   . Healthy Father     Social History Social History   Tobacco Use  . Smoking status: Never Smoker   . Smokeless tobacco: Never Used  Substance Use Topics  . Alcohol use: Yes    Alcohol/week: 0.0 standard drinks    Comment: occ  . Drug use: No     Allergies   Patient has no known allergies.   Review of Systems Review of Systems  Constitutional: Negative for activity change, chills and fatigue.  Respiratory: Negative for cough, choking and shortness of breath.   Neurological: Negative for dizziness, facial asymmetry and headaches.     Physical Exam Triage Vital Signs ED Triage Vitals  Enc Vitals Group     BP 10/21/19 0833 112/69     Pulse Rate 10/21/19 0833 80     Resp 10/21/19 0833 16     Temp 10/21/19 0833 98.2 F (36.8 C)     Temp Source 10/21/19 0833 Oral     SpO2 10/21/19 0833 98 %     Weight --      Height --      Head Circumference --      Peak Flow --      Pain Score 10/21/19 0951 0     Pain Loc --      Pain Edu? --      Excl. in [place]? --    No data found.  Updated Vital Signs BP 112/69 (BP Location: Left Arm)   Pulse 80  Temp 98.2 F (36.8 C) (Oral)   Resp 16   SpO2 98%   Visual Acuity Right Eye Distance:   Left Eye Distance:   Bilateral Distance:    Right Eye Near:   Left Eye Near:    Bilateral Near:     Physical Exam Constitutional:      General: He is not in acute distress.    Appearance: He is not ill-appearing or toxic-appearing.  Cardiovascular:     Rate and Rhythm: Normal rate and regular rhythm.     Pulses: Normal pulses.     Heart sounds: Normal heart sounds.  Skin:    General: Skin is warm and dry.  Neurological:     Mental Status: He is alert.      UC Treatments / Results  Labs (all labs ordered are listed, but only abnormal results are displayed) Labs Reviewed - No data to display  EKG   Radiology No results found.  Procedures Procedures (including critical care time)  Medications Ordered in UC Medications - No data to display  Initial Impression / Assessment and Plan / UC Course  I have reviewed the  triage vital signs and the nursing notes.  Pertinent labs & imaging results that were available during my care of the patient were reviewed by me and considered in my medical decision making (see chart for details).     1.  Recently recovered from COVID-19 infection: Work note has been given Patient can resume work. He is advised to continue  wearing his face mask, social distancing and respiratory etiquette/hand hygiene. Final Clinical Impressions(s) / UC Diagnoses   Final diagnoses:  [id number] virus infection   Discharge Instructions   None    ED Prescriptions    None     PDMP not reviewed this encounter.   Chase Picket, MD 10/22/19 [phone]

## 2019-10-29 ENCOUNTER — Ambulatory Visit (HOSPITAL_COMMUNITY)
Admission: EM | Admit: 2019-10-29 | Discharge: 2019-10-29 | Disposition: A | Discharge status: Home / Self Care | Payer: Self-pay

## 2019-10-29 NOTE — ED Notes (Signed)
Per pt access, pt only needed a work note.

## 2020-08-05 ENCOUNTER — Emergency Department (HOSPITAL_COMMUNITY)
Admission: EM | Admit: 2020-08-05 | Discharge: 2020-08-05 | Disposition: A | Discharge status: Home / Self Care | Payer: BC Managed Care – PPO | Attending: Emergency Medicine | Admitting: Emergency Medicine

## 2020-08-05 ENCOUNTER — Encounter (HOSPITAL_COMMUNITY): Payer: Self-pay

## 2020-08-05 ENCOUNTER — Emergency Department (HOSPITAL_COMMUNITY): Payer: BC Managed Care – PPO

## 2020-08-05 ENCOUNTER — Other Ambulatory Visit: Payer: Self-pay

## 2020-08-05 DIAGNOSIS — Y9389 Activity, other specified: Secondary | ICD-10-CM | POA: Insufficient documentation

## 2020-08-05 DIAGNOSIS — Z79899 Other long term (current) drug therapy: Secondary | ICD-10-CM | POA: Insufficient documentation

## 2020-08-05 DIAGNOSIS — S60511A Abrasion of right hand, initial encounter: Secondary | ICD-10-CM | POA: Insufficient documentation

## 2020-08-05 DIAGNOSIS — T1490XA Injury, unspecified, initial encounter: Secondary | ICD-10-CM

## 2020-08-05 DIAGNOSIS — I959 Hypotension, unspecified: Secondary | ICD-10-CM | POA: Diagnosis not present

## 2020-08-05 DIAGNOSIS — Y92410 Unspecified street and highway as the place of occurrence of the external cause: Secondary | ICD-10-CM | POA: Insufficient documentation

## 2020-08-05 DIAGNOSIS — G4489 Other headache syndrome: Secondary | ICD-10-CM | POA: Diagnosis not present

## 2020-08-05 DIAGNOSIS — Z041 Encounter for examination and observation following transport accident: Secondary | ICD-10-CM | POA: Diagnosis not present

## 2020-08-05 DIAGNOSIS — Y999 Unspecified external cause status: Secondary | ICD-10-CM | POA: Diagnosis not present

## 2020-08-05 DIAGNOSIS — R079 Chest pain, unspecified: Secondary | ICD-10-CM | POA: Diagnosis not present

## 2020-08-05 DIAGNOSIS — R0789 Other chest pain: Secondary | ICD-10-CM | POA: Diagnosis not present

## 2020-08-05 DIAGNOSIS — R519 Headache, unspecified: Secondary | ICD-10-CM | POA: Insufficient documentation

## 2020-08-05 LAB — CBC WITH DIFFERENTIAL/PLATELET
Abs Immature Granulocytes: 0.02 10*3/uL (ref 0.00–0.07)
Basophils Absolute: 0 10*3/uL (ref 0.0–0.1)
Basophils Relative: 1 %
Eosinophils Absolute: 0.2 10*3/uL (ref 0.0–0.5)
Eosinophils Relative: 4 %
HCT: 44.7 % (ref 39.0–52.0)
Hemoglobin: 14.1 g/dL (ref 13.0–17.0)
Immature Granulocytes: 0 %
Lymphocytes Relative: 41 %
Lymphs Abs: 2 10*3/uL (ref 0.7–4.0)
MCH: 27.5 pg (ref 26.0–34.0)
MCHC: 31.5 g/dL (ref 30.0–36.0)
MCV: 87.1 fL (ref 80.0–100.0)
Monocytes Absolute: 0.3 10*3/uL (ref 0.1–1.0)
Monocytes Relative: 7 %
Neutro Abs: 2.3 10*3/uL (ref 1.7–7.7)
Neutrophils Relative %: 47 %
Platelets: 191 10*3/uL (ref 150–400)
RBC: 5.13 MIL/uL (ref 4.22–5.81)
RDW: 13.3 % (ref 11.5–15.5)
WBC: 5 10*3/uL (ref 4.0–10.5)
nRBC: 0 % (ref 0.0–0.2)

## 2020-08-05 LAB — I-STAT CHEM 8, ED
BUN: 13 mg/dL (ref 6–20)
Calcium, Ion: 1.18 mmol/L (ref 1.15–1.40)
Chloride: 107 mmol/L (ref 98–111)
Creatinine, Ser: 1.1 mg/dL (ref 0.61–1.24)
Glucose, Bld: 93 mg/dL (ref 70–99)
HCT: 44 % (ref 39.0–52.0)
Hemoglobin: 15 g/dL (ref 13.0–17.0)
Potassium: 4.2 mmol/L (ref 3.5–5.1)
Sodium: 144 mmol/L (ref 135–145)
TCO2: 26 mmol/L (ref 22–32)

## 2020-08-05 MED ORDER — KETOROLAC TROMETHAMINE 30 MG/ML IJ SOLN
30.0000 mg | Freq: Once | INTRAMUSCULAR | Status: AC
  Administered 2020-08-05: 30 mg via INTRAVENOUS
  Filled 2020-08-05: qty 1

## 2020-08-05 MED ORDER — SODIUM CHLORIDE 0.9 % IV BOLUS
500.0000 mL | Freq: Once | INTRAVENOUS | Status: AC
  Administered 2020-08-05: 500 mL via INTRAVENOUS

## 2020-08-05 MED ORDER — MELOXICAM 15 MG PO TABS: 15.0000 mg | ORAL_TABLET | Freq: Every day | ORAL | 0 refills | Status: DC

## 2020-08-05 MED ORDER — IOHEXOL 300 MG/ML  SOLN
100.0000 mL | Freq: Once | INTRAMUSCULAR | Status: AC | PRN
  Administered 2020-08-05: 100 mL via INTRAVENOUS

## 2020-08-05 NOTE — ED Notes (Signed)
Pt given PPO fluids. Ambulated without difficulty.
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
## 2020-08-05 NOTE — ED Notes (Signed)
No bruising noted to head, chest, abdomen, or upper extremities. Pt c/o headache and right hand pain. Abrasion noted to right 5th finger.

## 2020-08-05 NOTE — ED Notes (Signed)
Ice pack placed to chest.

## 2020-10-09 ENCOUNTER — Other Ambulatory Visit: Payer: Self-pay

## 2020-10-09 ENCOUNTER — Ambulatory Visit (HOSPITAL_COMMUNITY)
Admission: EM | Admit: 2020-10-09 | Discharge: 2020-10-09 | Disposition: A | Discharge status: Home / Self Care | Payer: BC Managed Care – PPO | Attending: Urgent Care | Admitting: Urgent Care

## 2020-10-09 ENCOUNTER — Encounter (HOSPITAL_COMMUNITY): Payer: Self-pay

## 2020-10-09 DIAGNOSIS — M545 Low back pain, unspecified: Secondary | ICD-10-CM

## 2020-10-09 DIAGNOSIS — S39012A Strain of muscle, fascia and tendon of lower back, initial encounter: Secondary | ICD-10-CM

## 2020-10-09 MED ORDER — NAPROXEN 500 MG PO TABS: 500.0000 mg | ORAL_TABLET | Freq: Two times a day (BID) | ORAL | 0 refills | Status: DC

## 2020-10-09 MED ORDER — TIZANIDINE HCL 4 MG PO TABS: 4.0000 mg | ORAL_TABLET | Freq: Every day | ORAL | 0 refills | Status: DC

## 2020-10-09 NOTE — ED Provider Notes (Signed)
[facility]   MRN: [id number] DOB: 02-15-1971  Subjective:   Marc Wade is a 50 y.o. male presenting for 2-week history of persistent low back pain.  Patient states that symptoms started after he hurt his back at work, was lifting something very heavy off the ground.  He has continued to work.  Has not used very many oral medications.  States that he tried patches and used one Advil.  Denies dysuria, weakness, numbness or tingling, radicular symptoms.  Patient would also like a full panel of labs as he has difficulty with premature ejaculation.  He does not have a regular doctor.  States that sometimes he drinks 1 bottle of water per day even when he looks really hard.  No current facility-administered medications for this encounter.  Current Outpatient Medications:  .  cetirizine (ZYRTEC) 10 MG tablet, Take 1 tablet (10 mg total) by mouth daily., Disp: 30 tablet, Rfl: 11 .  ibuprofen (ADVIL,MOTRIN) 800 MG tablet, Take 1 tablet (800 mg total) by mouth every 8 (eight) hours as needed., Disp: 30 tablet, Rfl: 0 .  meloxicam (MOBIC) 15 MG tablet, Take 1 tablet (15 mg total) by mouth daily., Disp: 10 tablet, Rfl: 0 .  Multiple Vitamin (MULTIVITAMIN) tablet, Take 1 tablet by mouth daily. Reported on 10/20/2015, Disp: , Rfl:  .  PARoxetine (PAXIL) 10 MG tablet, Take 0.5-1.5 tablets (5-15 mg total) by mouth daily. Take the lowest effective dose., Disp: 45 tablet, Rfl: 11 .  promethazine (PHENERGAN) 6.25 MG/5ML syrup, Take 5 mLs (6.25 mg total) by mouth every 6 (six) hours as needed (Cough)., Disp: 120 mL, Rfl: 0 .  promethazine-dextromethorphan (PROMETHAZINE-DM) 6.25-15 MG/5ML syrup, Take 5 mLs by mouth 4 (four) times daily as needed for cough., Disp: 180 mL, Rfl: 0 .  trimethoprim-polymyxin b (POLYTRIM) ophthalmic solution, Place 1 drop into both eyes every 6 (six) hours., Disp: 10 mL, Rfl: 0   No Known Allergies  Past Medical History:  Diagnosis Date  . Allergy      History  reviewed. No pertinent surgical history.  Family History  Problem Relation Age of Onset  . Healthy Mother   . Healthy Father     Social History   Tobacco Use  . Smoking status: Never Smoker  . Smokeless tobacco: Never Used  Vaping Use  . Vaping Use: Never used  Substance Use Topics  . Alcohol use: Yes    Alcohol/week: 0.0 standard drinks    Comment: occ  . Drug use: No    ROS   Objective:   Vitals: BP 118/69 (BP Location: Right Arm)   Pulse 62   Temp 97.8 F (36.6 C) (Oral)   Resp 18   SpO2 100%   Physical Exam Constitutional:      General: He is not in acute distress.    Appearance: Normal appearance. He is well-developed and normal weight. He is not ill-appearing, toxic-appearing or diaphoretic.  HENT:     Head: Normocephalic and atraumatic.     Right Ear: External ear normal.     Left Ear: External ear normal.     Nose: Nose normal.     Mouth/Throat:     Pharynx: Oropharynx is clear.  Eyes:     General: No scleral icterus.       Right eye: No discharge.        Left eye: No discharge.     Extraocular Movements: Extraocular movements intact.     Pupils: Pupils are equal,  round, and reactive to light.  Cardiovascular:     Rate and Rhythm: Normal rate.  Pulmonary:     Effort: Pulmonary effort is normal.  Musculoskeletal:     Cervical back: Normal range of motion.     Lumbar back: Spasms (right sided) and tenderness present. No swelling, edema, deformity, signs of trauma, lacerations or bony tenderness. Decreased range of motion (slightly decreased flexion but otherwise normal). Negative right straight leg raise test and negative left straight leg raise test. No scoliosis.  Neurological:     Mental Status: He is alert and oriented to person, place, and time.     Motor: No weakness.     Coordination: Coordination normal.     Gait: Gait normal.     Deep Tendon Reflexes: Reflexes normal.  Psychiatric:        Mood and Affect: Mood normal.        Behavior:  Behavior normal.        Thought Content: Thought content normal.        Judgment: Judgment normal.     Assessment and Plan :   PDMP not reviewed this encounter.  1. Acute right-sided low back pain without sciatica   2. Strain of lumbar region, initial encounter     Suspect lumbar strain, worsened by lack of hydration and rest.  Recommended naproxen twice daily, tizanidine at bedtime, hydrate daily and more so on days that he works.  Establish care with a new PCP for a panel of labs, work-up on premature ejaculation. Counseled patient on potential for adverse effects with medications prescribed/recommended today, ER and return-to-clinic precautions discussed, patient verbalized understanding.    Wallis Bamberg, PA-C 10/09/20 1410

## 2020-10-09 NOTE — ED Triage Notes (Signed)
Pt presents with back pain from lifting something 2 weeks ago.

## 2020-10-13 ENCOUNTER — Other Ambulatory Visit: Payer: Self-pay

## 2020-10-13 ENCOUNTER — Encounter: Payer: Self-pay | Admitting: Family Medicine

## 2020-10-13 ENCOUNTER — Ambulatory Visit (INDEPENDENT_AMBULATORY_CARE_PROVIDER_SITE_OTHER): Payer: BC Managed Care – PPO | Admitting: Family Medicine

## 2020-10-13 VITALS — BP 130/75 | HR 58 | Temp 97.3°F | Ht 68.0 in | Wt 156.4 lb

## 2020-10-13 DIAGNOSIS — Z1211 Encounter for screening for malignant neoplasm of colon: Secondary | ICD-10-CM

## 2020-10-13 DIAGNOSIS — Z0000 Encounter for general adult medical examination without abnormal findings: Secondary | ICD-10-CM

## 2020-10-13 DIAGNOSIS — M545 Low back pain, unspecified: Secondary | ICD-10-CM

## 2020-10-13 DIAGNOSIS — G8929 Other chronic pain: Secondary | ICD-10-CM

## 2020-10-13 DIAGNOSIS — G4700 Insomnia, unspecified: Secondary | ICD-10-CM | POA: Diagnosis not present

## 2020-10-13 DIAGNOSIS — F524 Premature ejaculation: Secondary | ICD-10-CM

## 2020-10-13 DIAGNOSIS — Z0001 Encounter for general adult medical examination with abnormal findings: Secondary | ICD-10-CM

## 2020-10-13 DIAGNOSIS — Z789 Other specified health status: Secondary | ICD-10-CM

## 2020-10-13 DIAGNOSIS — R519 Headache, unspecified: Secondary | ICD-10-CM

## 2020-10-13 MED ORDER — PAROXETINE HCL 10 MG PO TABS: ORAL_TABLET | ORAL | 5 refills | Status: DC

## 2020-10-13 NOTE — Patient Instructions (Addendum)
On physical examination everything looks good.  We will let you know the results of your laboratory testing.  I have prescribed paroxetine (Paxil) 10 mg to take 1/2 pill daily for 1 week, then increase to 1 pill daily, usually best taken in the morning.  It may take being on it for 3 to 4 weeks before you notice a difference in the premature ejaculation.  If it is helping some but not well enough, return and we may be able to change the dose.  If your premature ejaculation is not improving, then you can contact our office and request a referral to a urologist.  If the assistant taking the call needs to, tell them to look at this note.  Try and get regular exercise because that will help you sleep better.  Good exercise will also help to strengthen your back.  The paroxetine may help you sleep better after you have been on it for a while.  You can also get over-the-counter melatonin and take it at bedtime to see if that will help you sleep.  Return annually or as needed.  Khi khm s?c kh?e, m?i th? c v? t?t.  Chng ti s? cho b?n bi?t k?t qu? ki?m tra trong phng th nghi?m c?a b?n.  Ti c k ??n paroxetine (Paxil) 10 mg u?ng 1/2 vin m?i ngy trong 1 tu?n, sau ? t?ng ln 1 vin m?i ngy, th??ng u?ng t?t nh?t vo bu?i sng. C th? m?t t? ??3 ??n 4 tu?n tr??c khi b?n nh?n th?y s? khc bi?t trong tnh tr?ng xu?t tinh s?m. N?u n gip ?? m?t s? nh?ng khng ?? t?t, hy quay l?i v chng ti c th? thay ??i li?u l??ng.  N?u tnh tr?ng xu?t tinh s?m c?a b?n khng ???c c?i thi?n, b?n c th? lin h? v?i v?n phng c?a chng ti v yu c?u gi?i thi?u ??n bc s? chuyn khoa ti?t ni?u. N?u tr? l nh?n cu?c g?i c?n, hy yu c?u h? xem ghi ch ny.  C? g?ng v t?p th? d?c th??ng xuyn v ?i?u ? s? gip b?n ng? ngon h?n. T?p th? d?c t?t c?ng s? gip t?ng c??ng s?c m?nh cho l?ng c?a b?n.  Thu?c paroxetine c th? gip b?n ng? ngon h?n sau m?t th?i gian.  B?n c?ng c th? mua melatonin khng k ??n  v u?ng tr??c khi ?i ng? ?? xem li?u ?i?u ? c gip b?n ng? ngon hay khng.  Tr? l?i hng n?m ho?c khi c?n thi?t.   Cc bi t?p th? d?c cho l?ng Back Exercises Nh?ng bi t?p th? d?c sau ?y lm t?ng s?c m?nh c?a cc c? gip nng ?? thn v l?ng. Cc bi t?p ny c?ng gip gi? cho ph?n l?ng d??i linh ho?t. Th?c hi?n nh?ng bi t?p ny c th? gip ng?n ng?a ?au l?ng ho?c gi?m b?t tnh tr?ng ?au hi?n c.  N?u qu v? b? ?au ho?c c c?m gic kh ch?u ? l?ng, hy c? g?ng th?c hi?n nh?ng bi t?p ny 2-3 l?n m?i ngy ho?c theo ch? d?n c?a chuyn gia ch?m [place] s?c kh?e.  Khi ?au c?i thi?n, hy t?p cc bi t?p ? m?i ngy, nh?ng t?ng s? l?n l?p l?i cc b??c cho m?i bi t?p (th?c hi?n nhi?u l?n l?p l?i h?n).  ?? ng?n ng?a b? ti pht ?au l?ng, hy ti?p t?c th?c hi?n cc bi t?p ny m?t l?n m?i ngy ho?c theo ch? d?n c?a chuyn gia ch?m [place] s?c kh?e. T?p chnh  xc nh? ch? d?n c?a chuyn gia ch?m [place] s?c kh?e v ?i?u ch?nh cc bi t?p theo h??ng d?n. Vi?c c?m th?y h?i c?ng c?, ko, th?t ho?c kh ch?u l bnh th??ng khi qu v? th?c hi?n nh?ng bi t?p ny, nh?ng qu v? nn d?ng t?p ngay n?u c?m th?y ?au ??t ng?t ho?c c?n ?au tr? nn tr?m tr?ng h?n. Cc bi t?p G?p m?t bn g?i ln ng?c L?p l?i cc b??c ny 3-5 l?n cho m?i chn: 1. N?m ng?a trn gi??ng c?ng ho?c sn nh, hai chn m? r?ng. 2. G?p m?t ??u g?i ln ng?c. Chn cn l?i v?n m? r?ng v ti?p xc v?i sn nh. 3. Dng c? hai tay gi? ??u g?i ho?c ?i ?? gi? ??u g?i ? nguyn v? tr. 4. Ko ??u g?i ln cho ??n khi qu v? c?m th?y h?i c?ng ? l?ng d??i ho?c mng. 5. Gi? cho c? c?ng trong 10-30 giy. 6. T? t? b? ra v du?i th?ng chn. Nghing hng L?p l?i cc b??c ny 5-10 l?n: 1. N?m ng?a trn gi??ng c?ng ho?c sn nh, hai chn m? r?ng. 2. G?p hai ??u g?i sao hai ??u g?i ch? v? pha tr?n nh v hai bn chn b?ng ph?ng trn sn nh. 3. Si?t ch?t cc c? b?ng d??i ?? ?n l?ng d??i c?a qu v? ln sn. ??ng tc ny s? nghing x??ng ch?u c?a qu v? nh? v?y x??ng  c?t s? h??ng ln v? pha tr?n nh thay v h??ng v? pha bn chn v sn nh. 4. Trong khi h?i c?ng ng??i v th? ??u, gi? t? th? ny trong 5-10 giy. Mo-b L?p l?i cc b??c ny cho ??n khi l?ng d??i c?a qu v? tr? nn linh ho?t h?n: 1. H? ng??i ? t? th? bn tay v ??u g?i trn m?t b? m?t ch?c ch?n. Gi? hai bn tay ? bn d??i hai vai v gi? cho ??u g?i bn d??i hng. Qu v? c th? ??t m?t t?m ??m ? d??i ??u g?i cho tho?i mi. 2. Ci ??u xu?ng v? pha ng?c. Co c? b?ng v ch? x??ng c?t v? pha sn nh nh? v?y l?ng d??i c?a qu v? ???c u?n cong gi?ng nh? l?ng c?a m?t con mo. 3. Gi? nguyn t? th? ny trong 5 giy. 4. T? t? nng ??u ln, ?? c? b?ng th? l?ng v ch? x??ng c?t ln v? pha tr?n nh ?? l?ng c?a qu v? t?o thnh m?t ???ng cong vng gi?ng nh? l?ng c?a m?t con b. 5. Gi? nguyn t? th? ny trong 5 giy.  Ch?ng ??y L?p l?i cc b??c ny 5-10 l?n: 1. N?m s?p (p m?t) trn sn. 2. ??t hai lng bn tay g?n ??u, kho?ng cch b?ng vai. 3. Gi? l?ng cng th? l?ng cng t?t v gi? hng ? trn sn nh, t? t? du?i th?ng hai cnh tay ?? nng n?a trn c?a c? th? v nh?c hai vai ln. Khng s? d?ng cc c? l?ng ?? nng ph?n trn c? th?. Qu v? c th? ?i?u ch?nh ch? ??t bn tay sao cho tho?i mi h?n. 4. Gi? ? t? th? ny trong 5 giy trong khi th? l?ng l?ng. 5. T? t? quay tr? v? t? th? n?m th?ng trn sn nh.  B?c c?u L?p l?i cc b??c ny 10 l?n: 1. N?m ng?a trn m?t b? m?t c?ng. 2. G?p hai ??u g?i sao hai ??u g?i ch? v? pha tr?n nh v hai bn chn b?ng ph?ng trn sn nh. Cnh tay c?a qu v?  ph?i du?i b?ng ph?ng ? hai bn, bn c?nh c? th? qu v?. 3. Si?t ch?t c? mng v nng mng ln kh?i sn nh cho ??n khi th?t l?ng ? ?? cao g?n b?ng ??u g?i. Qu v? c?n ph?i c?m th?y cc c? ?ang lm vi?c ? mng v pha sau ?i. N?u qu v? khng c?m th?y nh?ng c? ny, hy tr??t hai bn chn ra cch mng 1-2 inch. 4. Gi? nguyn t? th? ny trong 3-5 giy. 5. T? t? h? th?p hng v? t? th? b?t ??u v ?? c? mng hon ton th?  l?ng. N?u bi t?p ny qu d?, th? lm v?i hai tay b?t cho qua ng?c. G?p b?ng L?p l?i cc b??c ny 5-10 l?n: 1. N?m ng?a trn gi??ng c?ng ho?c sn nh, hai chn m? r?ng. 2. G?p hai ??u g?i sao hai ??u g?i ch? v? pha tr?n nh v hai bn chn b?ng ph?ng trn sn nh. 3. B?t cho hai tay qua ng?c. 4. ??y c?m nh? v? pha ng?c m khng g?p c?. 5. Si?t ch?t c? b?ng v t? t? nng thn ln (thn trn) ?? cao ?? nng hai b? vai kh?i sn nh m?t cht. Trnh nng thn trn cao h?n m?c ny b?i v n c th? t?o qu nhi?u p l?c ln l?ng d??i v khng gip t?ng c??ng s?c m?nh c?a c? b?ng. 6. T? t? tr? v? t? th? b?t ??u. Nng l?ng L?p l?i cc b??c ny 5-10 l?n: 1. N?m s?p (p m?t), hai tay du?i th?ng ? bn v trn t ln sn nh. 2. Si?t c? ? chn v mng. 3. T? t? nng ng?c ln kh?i sn nh trong khi gi? hng ?n xu?ng sn. Gi? ph?n sau ??u cong theo ???ng cong ? l?ng. Hai m?t ph?i nhn vo sn nh. 4. Gi? nguyn t? th? ny trong 3-5 giy. 5. T? t? tr? v? t? th? b?t ??u. Hy lin l?c v?i chuyn gia ch?m [place] s?c kh?e n?u:  Tnh tr?ng ?au ho?c c?m gic kh ch?u ? l?ng qu v? tr? nn tr?m tr?ng h?n nhi?u khi qu v? t?p th? d?c.  Tnh tr?ng ?au ho?c c?m gic kh ch?u tr?m tr?ng h?n ? l?ng qu v? khng gi?m trong vng 2 gi? sau khi t?p th? d?c. N?u qu v? c b?t k? v?n ?? no trong cc v?n ?? ny, hy ng?ng t?p nh?ng bi t?p ny ngay. Khng t?p l?i cc bi t?p ? tr? khi chuyn gia ch?m [place] s?c kh?e ni qu v? c th? lm nh? v?y. Yu c?u tr? gip ngay l?p t?c n?u:  Qu v? b? ?au l?ng r?t nhi?u, ??t ng?t. N?u ?i?u ny x?y ra, hy ng?ng t?p ngay l?p t?c. Khng t?p l?i cc bi t?p ? tr? khi chuyn gia ch?m [place] s?c kh?e ni qu v? c th? lm nh? v?y. Thng tin ny khng nh?m m?c ?ch thay th? cho l?i khuyn m chuyn gia ch?m [place] s?c kh?e ni v?i qu v?. Hy b?o ??m qu v? ph?i th?o lu?n b?t k? v?n ?? g m qu v? c v?i chuyn gia ch?m [place] s?c kh?e c?a qu v?. Document Revised: 07/22/2018 Document Reviewed:  07/22/2018 Elsevier Patient Education  [employer].   If you have lab work done today you will be contacted with your lab results within the next 2 weeks.  If you have not heard from [place] then please contact us. The fastest way to get your results is to register for My Chart.  IF you received an x-ray today, you will receive an invoice from [facility]. Please contact [facility] at [phone] with questions or concerns regarding your invoice.   IF you received labwork today, you will receive an invoice from [facility]. Please contact [facility] at [phone] with questions or concerns regarding your invoice.   Our billing staff will not be able to assist you with questions regarding bills from these companies.  You will be contacted with the lab results as soon as they are available. The fastest way to get your results is to activate your My Chart account. Instructions are located on the last page of this paperwork. If you have not heard from us regarding the results in 2 weeks, please contact this office.      

## 2020-10-13 NOTE — Addendum Note (Signed)
Addended by: Eldred Manges on: 10/13/2020 04:06 PM   Modules accepted: Orders

## 2020-10-13 NOTE — Addendum Note (Signed)
Addended by: Aliece Honold H on: 10/13/2020 04:08 PM   Modules accepted: Orders

## 2020-10-13 NOTE — Progress Notes (Signed)
Patient ID: Marc Wade, male    DOB: 1971/02/16  Age: 50 y.o. MRN: [id number]  Chief Complaint  Patient presents with  . Annual Exam  . Headache    On and off for the last 3 weeks   . early ejaculation     What can be done to assist with this.     Subjective:   Patient is here for his annual physical examination, though it has been several years since he had 1 apparently.  He has a couple of associated concerns or complaints.  From time to time he gets headaches.  He does not have them every week, and can go for months without.  Then he will have them more often.  He just rides them through and does not take a lot of medicine for it.  Sometimes he gets a tiny bit dizzy if he has a bad headache.  This is not a major change, and that he has been having these for a long time.  His other main concern is he has early ejaculation.  Often a minute or 2 after entering his wife he will ejaculate.  That has become a difficult issue in his marriage.  Although he did not end up taking any medications for it, as I questioned him about his medicine list it is apparent that he was once given paroxetine for it but he only took it for 2 or 3 days and it did not seem to help so he did not continue it.  His only regular medicine is tizanidine which she was given for his back.  He has been taking some naproxen with it.  He had a back strain and this was treated elsewhere last week.  He had been carrying a heavy potted plant.  Past medical history: Operations: None Medical illnesses: None Medications: Tizanidine, naproxen No joint drug allergies He has had his COVID vaccinations.    Social history: He is half [place], half [place]  Was raised by a family in [place] who have since deceased.  He does not know his natural parents or family history at all.  He came to the [place] as an immigrant 30 some years ago.  Never really learned English well.  He had had a difficult childhood.  He is married with a couple of  children.  He attends a [employer].  Family history: Unobtainable   Review of systems: Constitutional: Unremarkable HEENT: Unremarkable except for the intermittent headaches as noted above He gets regular hearing testing noted to place Cardiovascular: Unremarkable Respiratory: Unremarkable GI: Unremarkable GU: Unremarkable except for the premature ejaculation as above musculoskeletal: Unremarkable Dermatologic: Unremarkable Neurologic: Unremarkable Endocrinologic: Unremarkable Psychiatric: Unremarkable    Current allergies, medications, problem list, past/family and social histories reviewed.  Objective:  BP 130/75   Pulse (!) 58   Temp (!) 97.3 F (36.3 C) (Temporal)   Ht 5\' 8"  (1.727 m)   Wt 156 lb 6.4 oz (70.9 kg)   SpO2 100%   BMI 23.78 kg/m   Healthy-appearing male in no acute distress.  He still has some back pain when he moves around.  TMs normal.  Eyes PERRL.  Throat clear and teeth satisfactory.  Neck supple without nodes or thyromegaly.  No carotid bruits.  Chest is clear throughout stage.  Heart rate without murmurs, gallops, or arrhythmias.  Abdomen soft without mass or tenderness.  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  Normal male external genitalia with testes descended.  No hernias or testicular nodules.  Extremities unremarkable.  Skin normal, warm and dry.  Normal gait.  Assessment & Plan:   Assessment: 1. Annual physical exam   2. Premature ejaculation   3. Chronic bilateral low back pain without sciatica   4. Insomnia, unspecified type   5. Language barrier   6. Acute nonintractable headache, unspecified headache type       Plan: See instructions.  We had a very good interpreter today and communication was slow but good.  Orders Placed This Encounter  Procedures  . CBC with Differential/Platelet  . Comprehensive metabolic panel  . Hemoglobin A1c  . Lipid panel    Meds  ordered this encounter  Medications  . PARoxetine (PAXIL) 10 MG tablet    Sig: Take one half daily in the morning for 1 week, then increase to 1 daily for premature ejaculation.    Dispense:  30 tablet    Refill:  5         Patient Instructions     On physical examination everything looks good.  We will let you know the results of your laboratory testing.  I have prescribed paroxetine (Paxil) 10 mg to take 1/2 pill daily for 1 week, then increase to 1 pill daily, usually best taken in the morning.  It may take being on it for 3 to 4 weeks before you notice a difference in the premature ejaculation.  If it is helping some but not well enough, return and we may be able to change the dose.  If your premature ejaculation is not improving, then you can contact our office and request a referral to a urologist.  If the assistant taking the call needs to, tell them to look at this note.  Try and get regular exercise because that will help you sleep better.  Good exercise will also help to strengthen your back.  The paroxetine may help you sleep better after you have been on it for a while.  You can also get over-the-counter melatonin and take it at bedtime to see if that will help you sleep.  Return annually or as needed.  Khi khm s?c kh?e, m?i th? c v? t?t.  Chng ti s? cho b?n bi?t k?t qu? ki?m tra trong phng th nghi?m c?a b?n.  Ti c k ??n paroxetine (Paxil) 10 mg u?ng 1/2 vin m?i ngy trong 1 tu?n, sau ? t?ng ln 1 vin m?i ngy, th??ng u?ng t?t nh?t vo bu?i sng. C th? m?t t? ??3 ??n 4 tu?n tr??c khi b?n nh?n th?y s? khc bi?t trong tnh tr?ng xu?t tinh s?m. N?u n gip ?? m?t s? nh?ng khng ?? t?t, hy quay l?i v chng ti c th? thay ??i li?u l??ng.  N?u tnh tr?ng xu?t tinh s?m c?a b?n khng ???c c?i thi?n, b?n c th? lin h? v?i v?n phng c?a chng ti v yu c?u gi?i thi?u ??n bc s? chuyn khoa ti?t ni?u. N?u tr? l nh?n cu?c g?i c?n, hy yu c?u h? xem ghi ch  ny.  C? g?ng v t?p th? d?c th??ng xuyn v ?i?u ? s? gip b?n ng? ngon h?n. T?p th? d?c t?t c?ng s? gip t?ng c??ng s?c m?nh cho l?ng c?a b?n.  Thu?c paroxetine c th? gip b?n ng? ngon h?n sau m?t th?i gian.  B?n c?ng c th? mua melatonin khng k ??n v u?ng tr??c khi ?i ng? ?? xem li?u ?i?u ? c gip  b?n ng? ngon hay khng.  Tr? l?i hng n?m ho?c khi c?n thi?t.   Cc bi t?p th? d?c cho l?ng Back Exercises Nh?ng bi t?p th? d?c sau ?y lm t?ng s?c m?nh c?a cc c? gip nng ?? thn v l?ng. Cc bi t?p ny c?ng gip gi? cho ph?n l?ng d??i linh ho?t. Th?c hi?n nh?ng bi t?p ny c th? gip ng?n ng?a ?au l?ng ho?c gi?m b?t tnh tr?ng ?au hi?n c.  N?u qu v? b? ?au ho?c c c?m gic kh ch?u ? l?ng, hy c? g?ng th?c hi?n nh?ng bi t?p ny 2-3 l?n m?i ngy ho?c theo ch? d?n c?a chuyn gia ch?m [place] s?c kh?e.  Khi ?au c?i thi?n, hy t?p cc bi t?p ? m?i ngy, nh?ng t?ng s? l?n l?p l?i cc b??c cho m?i bi t?p (th?c hi?n nhi?u l?n l?p l?i h?n).  ?? ng?n ng?a b? ti pht ?au l?ng, hy ti?p t?c th?c hi?n cc bi t?p ny m?t l?n m?i ngy ho?c theo ch? d?n c?a chuyn gia ch?m [place] s?c kh?e. T?p chnh xc nh? ch? d?n c?a chuyn gia ch?m [place] s?c kh?e v ?i?u ch?nh cc bi t?p theo h??ng d?n. Vi?c c?m th?y h?i c?ng c?, ko, th?t ho?c kh ch?u l bnh th??ng khi qu v? th?c hi?n nh?ng bi t?p ny, nh?ng qu v? nn d?ng t?p ngay n?u c?m th?y ?au ??t ng?t ho?c c?n ?au tr? nn tr?m tr?ng h?n. Cc bi t?p G?p m?t bn g?i ln ng?c L?p l?i cc b??c ny 3-5 l?n cho m?i chn: 1. N?m ng?a trn gi??ng c?ng ho?c sn nh, hai chn m? r?ng. 2. G?p m?t ??u g?i ln ng?c. Chn cn l?i v?n m? r?ng v ti?p xc v?i sn nh. 3. Dng c? hai tay gi? ??u g?i ho?c ?i ?? gi? ??u g?i ? nguyn v? tr. 4. Ko ??u g?i ln cho ??n khi qu v? c?m th?y h?i c?ng ? l?ng d??i ho?c mng. 5. Gi? cho c? c?ng trong 10-30 giy. 6. T? t? b? ra v du?i th?ng chn. Nghing hng L?p l?i cc b??c ny 5-10 l?n: 1. N?m ng?a trn gi??ng  c?ng ho?c sn nh, hai chn m? r?ng. 2. G?p hai ??u g?i sao hai ??u g?i ch? v? pha tr?n nh v hai bn chn b?ng ph?ng trn sn nh. 3. Si?t ch?t cc c? b?ng d??i ?? ?n l?ng d??i c?a qu v? ln sn. ??ng tc ny s? nghing x??ng ch?u c?a qu v? nh? v?y x??ng c?t s? h??ng ln v? pha tr?n nh thay v h??ng v? pha bn chn v sn nh. 4. Trong khi h?i c?ng ng??i v th? ??u, gi? t? th? ny trong 5-10 giy. Mo-b L?p l?i cc b??c ny cho ??n khi l?ng d??i c?a qu v? tr? nn linh ho?t h?n: 1. H? ng??i ? t? th? bn tay v ??u g?i trn m?t b? m?t ch?c ch?n. Gi? hai bn tay ? bn d??i hai vai v gi? cho ??u g?i bn d??i hng. Qu v? c th? ??t m?t t?m ??m ? d??i ??u g?i cho tho?i mi. 2. Ci ??u xu?ng v? pha ng?c. Co c? b?ng v ch? x??ng c?t v? pha sn nh nh? v?y l?ng d??i c?a qu v? ???c u?n cong gi?ng nh? l?ng c?a m?t con mo. 3. Gi? nguyn t? th? ny trong 5 giy. 4. T? t? nng ??u ln, ?? c? b?ng th? l?ng v ch? x??ng c?t ln v? pha tr?n nh ?? l?ng c?a qu v? t?o  thnh m?t ???ng cong vng gi?ng nh? l?ng c?a m?t con b. 5. Gi? nguyn t? th? ny trong 5 giy.  Ch?ng ??y L?p l?i cc b??c ny 5-10 l?n: 1. N?m s?p (p m?t) trn sn. 2. ??t hai lng bn tay g?n ??u, kho?ng cch b?ng vai. 3. Gi? l?ng cng th? l?ng cng t?t v gi? hng ? trn sn nh, t? t? du?i th?ng hai cnh tay ?? nng n?a trn c?a c? th? v nh?c hai vai ln. Khng s? d?ng cc c? l?ng ?? nng ph?n trn c? th?. Qu v? c th? ?i?u ch?nh ch? ??t bn tay sao cho tho?i mi h?n. 4. Gi? ? t? th? ny trong 5 giy trong khi th? l?ng l?ng. 5. T? t? quay tr? v? t? th? n?m th?ng trn sn nh.  B?c c?u L?p l?i cc b??c ny 10 l?n: 1. N?m ng?a trn m?t b? m?t c?ng. 2. G?p hai ??u g?i sao hai ??u g?i ch? v? pha tr?n nh v hai bn chn b?ng ph?ng trn sn nh. Cnh tay c?a qu v? ph?i du?i b?ng ph?ng ? hai bn, bn c?nh c? th? qu v?. 3. Si?t ch?t c? mng v nng mng ln kh?i sn nh cho ??n khi th?t l?ng ? ?? cao g?n b?ng ??u g?i. Qu v?  c?n ph?i c?m th?y cc c? ?ang lm vi?c ? mng v pha sau ?i. N?u qu v? khng c?m th?y nh?ng c? ny, hy tr??t hai bn chn ra cch mng 1-2 inch. 4. Gi? nguyn t? th? ny trong 3-5 giy. 5. T? t? h? th?p hng v? t? th? b?t ??u v ?? c? mng hon ton th? l?ng. N?u bi t?p ny qu d?, th? lm v?i hai tay b?t cho qua ng?c. G?p b?ng L?p l?i cc b??c ny 5-10 l?n: 1. N?m ng?a trn gi??ng c?ng ho?c sn nh, hai chn m? r?ng. 2. G?p hai ??u g?i sao hai ??u g?i ch? v? pha tr?n nh v hai bn chn b?ng ph?ng trn sn nh. 3. B?t cho hai tay qua ng?c. 4. ??y c?m nh? v? pha ng?c m khng g?p c?. 5. Si?t ch?t c? b?ng v t? t? nng thn ln (thn trn) ?? cao ?? nng hai b? vai kh?i sn nh m?t cht. Trnh nng thn trn cao h?n m?c ny b?i v n c th? t?o qu nhi?u p l?c ln l?ng d??i v khng gip t?ng c??ng s?c m?nh c?a c? b?ng. 6. T? t? tr? v? t? th? b?t ??u. Nng l?ng L?p l?i cc b??c ny 5-10 l?n: 1. N?m s?p (p m?t), hai tay du?i th?ng ? bn v trn t ln sn nh. 2. Si?t c? ? chn v mng. 3. T? t? nng ng?c ln kh?i sn nh trong khi gi? hng ?n xu?ng sn. Gi? ph?n sau ??u cong theo ???ng cong ? l?ng. Hai m?t ph?i nhn vo sn nh. 4. Gi? nguyn t? th? ny trong 3-5 giy. 5. T? t? tr? v? t? th? b?t ??u. Hy lin l?c v?i chuyn gia ch?m [place] s?c kh?e n?u:  Tnh tr?ng ?au ho?c c?m gic kh ch?u ? l?ng qu v? tr? nn tr?m tr?ng h?n nhi?u khi qu v? t?p th? d?c.  Tnh tr?ng ?au ho?c c?m gic kh ch?u tr?m tr?ng h?n ? l?ng qu v? khng gi?m trong vng 2 gi? sau khi t?p th? d?c. N?u qu v? c b?t k? v?n ?? no trong cc v?n ?? ny, hy ng?ng t?p nh?ng bi t?p ny ngay. Khng t?p  l?i cc bi t?p ? tr? khi chuyn gia ch?m [place] s?c kh?e ni qu v? c th? lm nh? v?y. Yu c?u tr? gip ngay l?p t?c n?u:  Qu v? b? ?au l?ng r?t nhi?u, ??t ng?t. N?u ?i?u ny x?y ra, hy ng?ng t?p ngay l?p t?c. Khng t?p l?i cc bi t?p ? tr? khi chuyn gia ch?m [place] s?c kh?e ni qu v? c th? lm nh? v?y. Thng  tin ny khng nh?m m?c ?ch thay th? cho l?i khuyn m chuyn gia ch?m [place] s?c kh?e ni v?i qu v?. Hy b?o ??m qu v? ph?i th?o lu?n b?t k? v?n ?? g m qu v? c v?i chuyn gia ch?m [place] s?c kh?e c?a qu v?. Document Revised: 07/22/2018 Document Reviewed: 07/22/2018 Elsevier Patient Education  [employer].   If you have lab work done today you will be contacted with your lab results within the next 2 weeks.  If you have not heard from [place] then please contact us. The fastest way to get your results is to register for My Chart.   IF you received an x-ray today, you will receive an invoice from [facility]. Please contact [facility] at [phone] with questions or concerns regarding your invoice.   IF you received labwork today, you will receive an invoice from [facility]. Please contact [facility] at [phone] with questions or concerns regarding your invoice.   Our billing staff will not be able to assist you with questions regarding bills from these companies.  You will be contacted with the lab results as soon as they are available. The fastest way to get your results is to activate your My Chart account. Instructions are located on the last page of this paperwork. If you have not heard from [place] regarding the results in 2 weeks, please contact this office.         Return if symptoms worsen or fail to improve.   Janace Hoard, MD 10/13/2020

## 2020-10-14 LAB — COMPREHENSIVE METABOLIC PANEL
ALT: 54 IU/L — ABNORMAL HIGH (ref 0–44)
AST: 27 IU/L (ref 0–40)
Albumin/Globulin Ratio: 1.6 (ref 1.2–2.2)
Albumin: 4.9 g/dL (ref 3.8–4.9)
Alkaline Phosphatase: 63 IU/L (ref 44–121)
BUN/Creatinine Ratio: 17 (ref 9–20)
BUN: 14 mg/dL (ref 6–24)
Bilirubin Total: 0.5 mg/dL (ref 0.0–1.2)
CO2: 26 mmol/L (ref 20–29)
Calcium: 10 mg/dL (ref 8.7–10.2)
Chloride: 103 mmol/L (ref 96–106)
Creatinine, Ser: 0.83 mg/dL (ref 0.76–1.27)
GFR calc Af Amer: 117 mL/min/{1.73_m2} (ref >59)
GFR calc non Af Amer: 101 mL/min/{1.73_m2} (ref >59)
Globulin, Total: 3 g/dL (ref 1.5–4.5)
Glucose: 81 mg/dL (ref 65–99)
Potassium: 4.5 mmol/L (ref 3.5–5.2)
Sodium: 141 mmol/L (ref 134–144)
Total Protein: 7.9 g/dL (ref 6.0–8.5)

## 2020-10-14 LAB — CBC WITH DIFFERENTIAL/PLATELET
Basophils Absolute: 0 10*3/uL (ref 0.0–0.2)
Basos: 1 %
EOS (ABSOLUTE): 0.3 10*3/uL (ref 0.0–0.4)
Eos: 5 %
Hematocrit: 44.1 % (ref 37.5–51.0)
Hemoglobin: 14.3 g/dL (ref 13.0–17.7)
Immature Grans (Abs): 0 10*3/uL (ref 0.0–0.1)
Immature Granulocytes: 0 %
Lymphocytes Absolute: 1.9 10*3/uL (ref 0.7–3.1)
Lymphs: 35 %
MCH: 27.2 pg (ref 26.6–33.0)
MCHC: 32.4 g/dL (ref 31.5–35.7)
MCV: 84 fL (ref 79–97)
Monocytes Absolute: 0.4 10*3/uL (ref 0.1–0.9)
Monocytes: 7 %
Neutrophils Absolute: 2.9 10*3/uL (ref 1.4–7.0)
Neutrophils: 52 %
Platelets: 205 10*3/uL (ref 150–450)
RBC: 5.25 x10E6/uL (ref 4.14–5.80)
RDW: 13 % (ref 11.6–15.4)
WBC: 5.5 10*3/uL (ref 3.4–10.8)

## 2020-10-14 LAB — LIPID PANEL
Chol/HDL Ratio: 3.5 ratio (ref 0.0–5.0)
Cholesterol, Total: 156 mg/dL (ref 100–199)
HDL: 44 mg/dL (ref >39)
LDL Chol Calc (NIH): 99 mg/dL (ref 0–99)
Triglycerides: 68 mg/dL (ref 0–149)
VLDL Cholesterol Cal: 13 mg/dL (ref 5–40)

## 2020-10-14 LAB — HEMOGLOBIN A1C
Est. average glucose Bld gHb Est-mCnc: 114 mg/dL
Hgb A1c MFr Bld: 5.6 % (ref 4.8–5.6)

## 2020-10-18 ENCOUNTER — Encounter: Payer: Self-pay | Admitting: Radiology

## 2020-11-10 ENCOUNTER — Encounter: Payer: Self-pay | Admitting: Gastroenterology

## 2020-11-17 ENCOUNTER — Other Ambulatory Visit: Payer: Self-pay | Admitting: Family Medicine

## 2020-11-17 DIAGNOSIS — Z1211 Encounter for screening for malignant neoplasm of colon: Secondary | ICD-10-CM
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
## 2020-12-28 ENCOUNTER — Encounter: Payer: BC Managed Care – PPO | Admitting: Gastroenterology

## 2021-01-30 ENCOUNTER — Encounter: Payer: BC Managed Care – PPO | Admitting: Gastroenterology

## 2021-09-26 ENCOUNTER — Ambulatory Visit (HOSPITAL_COMMUNITY)
Admission: EM | Admit: 2021-09-26 | Discharge: 2021-09-26 | Disposition: A | Discharge status: Home / Self Care | Payer: BC Managed Care – PPO | Attending: Physician Assistant | Admitting: Physician Assistant

## 2021-09-26 ENCOUNTER — Other Ambulatory Visit: Payer: Self-pay

## 2021-09-26 ENCOUNTER — Encounter (HOSPITAL_COMMUNITY): Payer: Self-pay

## 2021-09-26 ENCOUNTER — Ambulatory Visit (INDEPENDENT_AMBULATORY_CARE_PROVIDER_SITE_OTHER): Payer: BC Managed Care – PPO

## 2021-09-26 DIAGNOSIS — R051 Acute cough: Secondary | ICD-10-CM | POA: Diagnosis not present

## 2021-09-26 DIAGNOSIS — Z20822 Contact with and (suspected) exposure to covid-19: Secondary | ICD-10-CM | POA: Diagnosis not present

## 2021-09-26 DIAGNOSIS — R0989 Other specified symptoms and signs involving the circulatory and respiratory systems: Secondary | ICD-10-CM | POA: Diagnosis not present

## 2021-09-26 DIAGNOSIS — J069 Acute upper respiratory infection, unspecified: Secondary | ICD-10-CM | POA: Diagnosis not present

## 2021-09-26 DIAGNOSIS — J9801 Acute bronchospasm: Secondary | ICD-10-CM | POA: Diagnosis not present

## 2021-09-26 DIAGNOSIS — R059 Cough, unspecified: Secondary | ICD-10-CM

## 2021-09-26 DIAGNOSIS — J029 Acute pharyngitis, unspecified: Secondary | ICD-10-CM | POA: Diagnosis not present

## 2021-09-26 LAB — POC INFLUENZA A AND B ANTIGEN (URGENT CARE ONLY)
INFLUENZA A ANTIGEN, POC: NEGATIVE
INFLUENZA B ANTIGEN, POC: NEGATIVE

## 2021-09-26 MED ORDER — PROMETHAZINE-DM 6.25-15 MG/5ML PO SYRP: 5.0000 mL | ORAL_SOLUTION | Freq: Two times a day (BID) | ORAL | 0 refills | Status: DC | PRN

## 2021-09-26 MED ORDER — METHYLPREDNISOLONE SODIUM SUCC 125 MG IJ SOLR
INTRAMUSCULAR | Status: AC
  Filled 2021-09-26: qty 2

## 2021-09-26 MED ORDER — ALBUTEROL SULFATE HFA 108 (90 BASE) MCG/ACT IN AERS
INHALATION_SPRAY | RESPIRATORY_TRACT | Status: AC
  Filled 2021-09-26: qty 6.7

## 2021-09-26 MED ORDER — ALBUTEROL SULFATE HFA 108 (90 BASE) MCG/ACT IN AERS
2.0000 | INHALATION_SPRAY | Freq: Once | RESPIRATORY_TRACT | Status: AC
  Administered 2021-09-26: 18:00:00 2 via RESPIRATORY_TRACT

## 2021-09-26 MED ORDER — METHYLPREDNISOLONE SODIUM SUCC 125 MG IJ SOLR
60.0000 mg | Freq: Once | INTRAMUSCULAR | Status: AC
  Administered 2021-09-26: 18:00:00 60 mg via INTRAMUSCULAR

## 2021-09-26 MED ORDER — PREDNISONE 20 MG PO TABS: 40.0000 mg | ORAL_TABLET | Freq: Every day | ORAL | 0 refills | Status: AC

## 2021-09-26 NOTE — ED Triage Notes (Signed)
Pt presents with c/o cough and sore throat X 3 days.   States Tuesday he felt really tired and states he thinks he has the Flu.

## 2021-09-26 NOTE — ED Provider Notes (Signed)
[facility]    CSN: [id number] Arrival date & time: 09/26/21  1610      History   Chief Complaint Chief Complaint  Patient presents with   Cough   Sore Throat    HPI CARRELL RAHMANI is a 50 y.o. male.   Patient presents today with 3-day history of URI symptoms.  He is minimally speaking and phone interpreter was utilized during visit.  Reports cough, sore throat, body aches, fatigue, shortness of breath.  Denies any chest pain, fever, nausea, vomiting, diarrhea.  Denies any known sick contacts.  He has tried Advil, NyQuil, multisymptom medication without improvement of symptoms.  He has had influenza vaccine in September at his employer.  He has had COVID-vaccine.  He has not had COVID in the past.  He denies history of asthma, allergies, smoking.  He reports cough is worsening prompting evaluation today.  Denies any recent antibiotic use.   Past Medical History:  Diagnosis Date   Allergy     There are no problems to display for this patient.   History reviewed. No pertinent surgical history.     Home Medications    Prior to Admission medications   Medication Sig Start Date End Date Taking? Authorizing Provider  predniSONE (DELTASONE) 20 MG tablet Take 2 tablets (40 mg total) by mouth daily for 4 days. 09/26/21 09/30/21 Yes Maisie Hauser K, PA-C  promethazine-dextromethorphan (PROMETHAZINE-DM) 6.25-15 MG/5ML syrup Take 5 mLs by mouth 2 (two) times daily as needed for cough. 09/26/21  Yes Diva Lemberger K, PA-C  Multiple Vitamin (MULTIVITAMIN) tablet Take 1 tablet by mouth daily. Reported on 10/20/2015    [provider]  naproxen (NAPROSYN) 500 MG tablet Take 500 mg by mouth 2 (two) times daily with a meal.    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 10/13/20   Peyton Najjar, MD  tiZANidine (ZANAFLEX) 4 MG tablet Take 1 tablet (4 mg total) by  mouth at bedtime. 10/09/20   Wallis Bamberg, PA-C    Family History Family History  Problem Relation Age of Onset   Healthy Mother    Healthy Father     Social History Social History   Tobacco Use   Smoking status: Never   Smokeless tobacco: Never  Vaping Use   Vaping Use: Never used  Substance Use Topics   Alcohol use: Yes    Alcohol/week: 0.0 standard drinks    Comment: occ   Drug use: No     Allergies   Patient has no known allergies.   Review of Systems Review of Systems  Constitutional:  Positive for activity change and fatigue. Negative for appetite change and fever.  HENT:  Positive for congestion and sore throat. Negative for sinus pressure and sneezing.   Respiratory:  Positive for cough and shortness of breath.   Cardiovascular:  Negative for chest pain.  Gastrointestinal:  Negative for abdominal pain, diarrhea, nausea and vomiting.  Genitourinary:  Positive for urgency.  Musculoskeletal:  Positive for arthralgias and myalgias.  Neurological:  Positive for headaches. Negative for dizziness and light-headedness.    Physical Exam Triage Vital Signs ED Triage Vitals  Enc Vitals Group     BP 09/26/21 1727 119/76     Pulse Rate 09/26/21 1725 86     Resp 09/26/21 1725 16     Temp 09/26/21 1727 99.9 F (37.7 C)  Temp Source 09/26/21 1727 Oral     SpO2 09/26/21 1725 98 %     Weight --      Height --      Head Circumference --      Peak Flow --      Pain Score 09/26/21 1725 0     Pain Loc --      Pain Edu? --      Excl. in GC? --    No data found.  Updated Vital Signs BP 119/76 (BP Location: Right Arm)    Pulse 86    Temp 99.9 F (37.7 C) (Oral)    Resp 16    SpO2 98%   Visual Acuity Right Eye Distance:   Left Eye Distance:   Bilateral Distance:    Right Eye Near:   Left Eye Near:    Bilateral Near:     Physical Exam Vitals reviewed.  Constitutional:      General: He is awake.     Appearance: Normal appearance. He is well-developed. He  is not ill-appearing.     Comments: Very pleasant male appears stated age in no acute distress sitting comfortably in exam room  HENT:     Head: Normocephalic and atraumatic.     Right Ear: Tympanic membrane, ear canal and external ear normal. Tympanic membrane is not erythematous or bulging.     Left Ear: Tympanic membrane, ear canal and external ear normal. Tympanic membrane is not erythematous or bulging.     Nose: Nose normal.     Mouth/Throat:     Pharynx: Uvula midline. Posterior oropharyngeal erythema present. No oropharyngeal exudate.  Cardiovascular:     Rate and Rhythm: Normal rate and regular rhythm.     Heart sounds: Normal heart sounds, S1 normal and S2 normal. No murmur heard. Pulmonary:     Effort: Pulmonary effort is normal. No accessory muscle usage or respiratory distress.     Breath sounds: No stridor. Rhonchi present. No wheezing or rales.     Comments: Widespread rhonchi do not clear with cough Abdominal:     Palpations: Abdomen is soft.     Tenderness: There is no abdominal tenderness.  Neurological:     Mental Status: He is alert.  Psychiatric:        Behavior: Behavior is cooperative.     UC Treatments / Results  Labs (all labs ordered are listed, but only abnormal results are displayed) Labs Reviewed  SARS CORONAVIRUS 2 (TAT 6-24 HRS)  POC INFLUENZA A AND B ANTIGEN (URGENT CARE ONLY)    EKG   Radiology DG Chest 2 View  Result Date: 09/26/2021 CLINICAL DATA:  Cough, rhonchi EXAM: CHEST - 2 VIEW COMPARISON:  08/05/2020 FINDINGS: Lungs are clear.  No pleural effusion or pneumothorax. The heart is normal in size. Visualized osseous structures are within normal limits. IMPRESSION: Normal chest radiographs. Electronically Signed   By: Charline Bills M.D.   On: 09/26/2021 17:50    Procedures Procedures (including critical care time)  Medications Ordered in UC Medications  albuterol (VENTOLIN HFA) 108 (90 Base) MCG/ACT inhaler 2 puff (2 puffs  Inhalation Given 09/26/21 1811)  methylPREDNISolone sodium succinate (SOLU-MEDROL) 125 mg/2 mL injection 60 mg (60 mg Intramuscular Given 09/26/21 1811)    Initial Impression / Assessment and Plan / UC Course  I have reviewed the triage vital signs and the nursing notes.  Pertinent labs & imaging results that were available during my care of the patient were reviewed by me  and considered in my medical decision making (see chart for details).     Discussed likely viral etiology given short duration of symptoms.  No evidence of acute infection on physical exam that warrant initiation of antibiotics.  Patient did have significant rhonchi so chest x-ray was obtained that showed no acute abnormality.  He was given 60 mg of Solu-Medrol and albuterol in clinic with improvement but not resolution of symptoms.  He was instructed on how to properly use albuterol and recommended he use this every 4-6 hours as needed for shortness of breath and chest discomfort; was sent home with albuterol inhaler.  We will start prednisone burst (40 mg x 4 days) and he was instructed to avoid NSAIDs.  Can use Tylenol, Mucinex, Flonase for symptom relief.  Flu testing was negative.  COVID test is pending.  He was provided work excuse note with current CDC return to work guidelines based on COVID test result.  He was provided prescription for Promethazine DM with instruction not to drive or drink alcohol while taking this medication as drowsiness is a common side effect.  Discussed alarm symptoms that warrant emergent evaluation including high fever not responding to medication, chest pain, shortness of breath, persistent cough.  Strict return precautions given to which he expressed understanding.  Final Clinical Impressions(s) / UC Diagnoses   Final diagnoses:  Upper respiratory tract infection, unspecified type  Acute cough  Bronchospasm     Discharge Instructions      We will contact you if your COVID test is  positive.  Use prednisone burst 40 mg for 4 days.  Do not take NSAIDs including aspirin, ibuprofen/Advil, naproxen/Aleve.  Use Promethazine DM for cough.  This can make you sleepy so do not drive or drink alcohol taking it.  Use Mucinex and Flonase for additional symptom relief.  Make sure you rest and drink plenty of fluid.  Use albuterol every 4-6 hours as needed.  If you have any worsening symptoms including shortness of breath, chest pain, nausea, vomiting you need to be seen immediately.     ED Prescriptions     Medication Sig Dispense Auth. Provider   predniSONE (DELTASONE) 20 MG tablet Take 2 tablets (40 mg total) by mouth daily for 4 days. 8 tablet Larine Fielding K, PA-C   promethazine-dextromethorphan (PROMETHAZINE-DM) 6.25-15 MG/5ML syrup Take 5 mLs by mouth 2 (two) times daily as needed for cough. 118 mL Derelle Cockrell K, PA-C      PDMP not reviewed this encounter.   Jeani Hawking, PA-C 09/26/21 1842

## 2021-09-26 NOTE — ED Notes (Signed)
Flu swab in lab 

## 2021-09-26 NOTE — Discharge Instructions (Signed)
We will contact you if your COVID test is positive.  Use prednisone burst 40 mg for 4 days.  Do not take NSAIDs including aspirin, ibuprofen/Advil, naproxen/Aleve.  Use Promethazine DM for cough.  This can make you sleepy so do not drive or drink alcohol taking it.  Use Mucinex and Flonase for additional symptom relief.  Make sure you rest and drink plenty of fluid.  Use albuterol every 4-6 hours as needed.  If you have any worsening symptoms including shortness of breath, chest pain, nausea, vomiting you need to be seen immediately.

## 2021-09-27 LAB — SARS CORONAVIRUS 2 (TAT 6-24 HRS): SARS Coronavirus 2: NEGATIVE

## 2022-02-03 IMAGING — DX DG CHEST 2V
2 series · 2 of 2 positions shown · non-contrast
Comparison: 08/05/2020

CLINICAL DATA: Cough, rhonchi

EXAM:
CHEST - 2 VIEW

[chest pa]
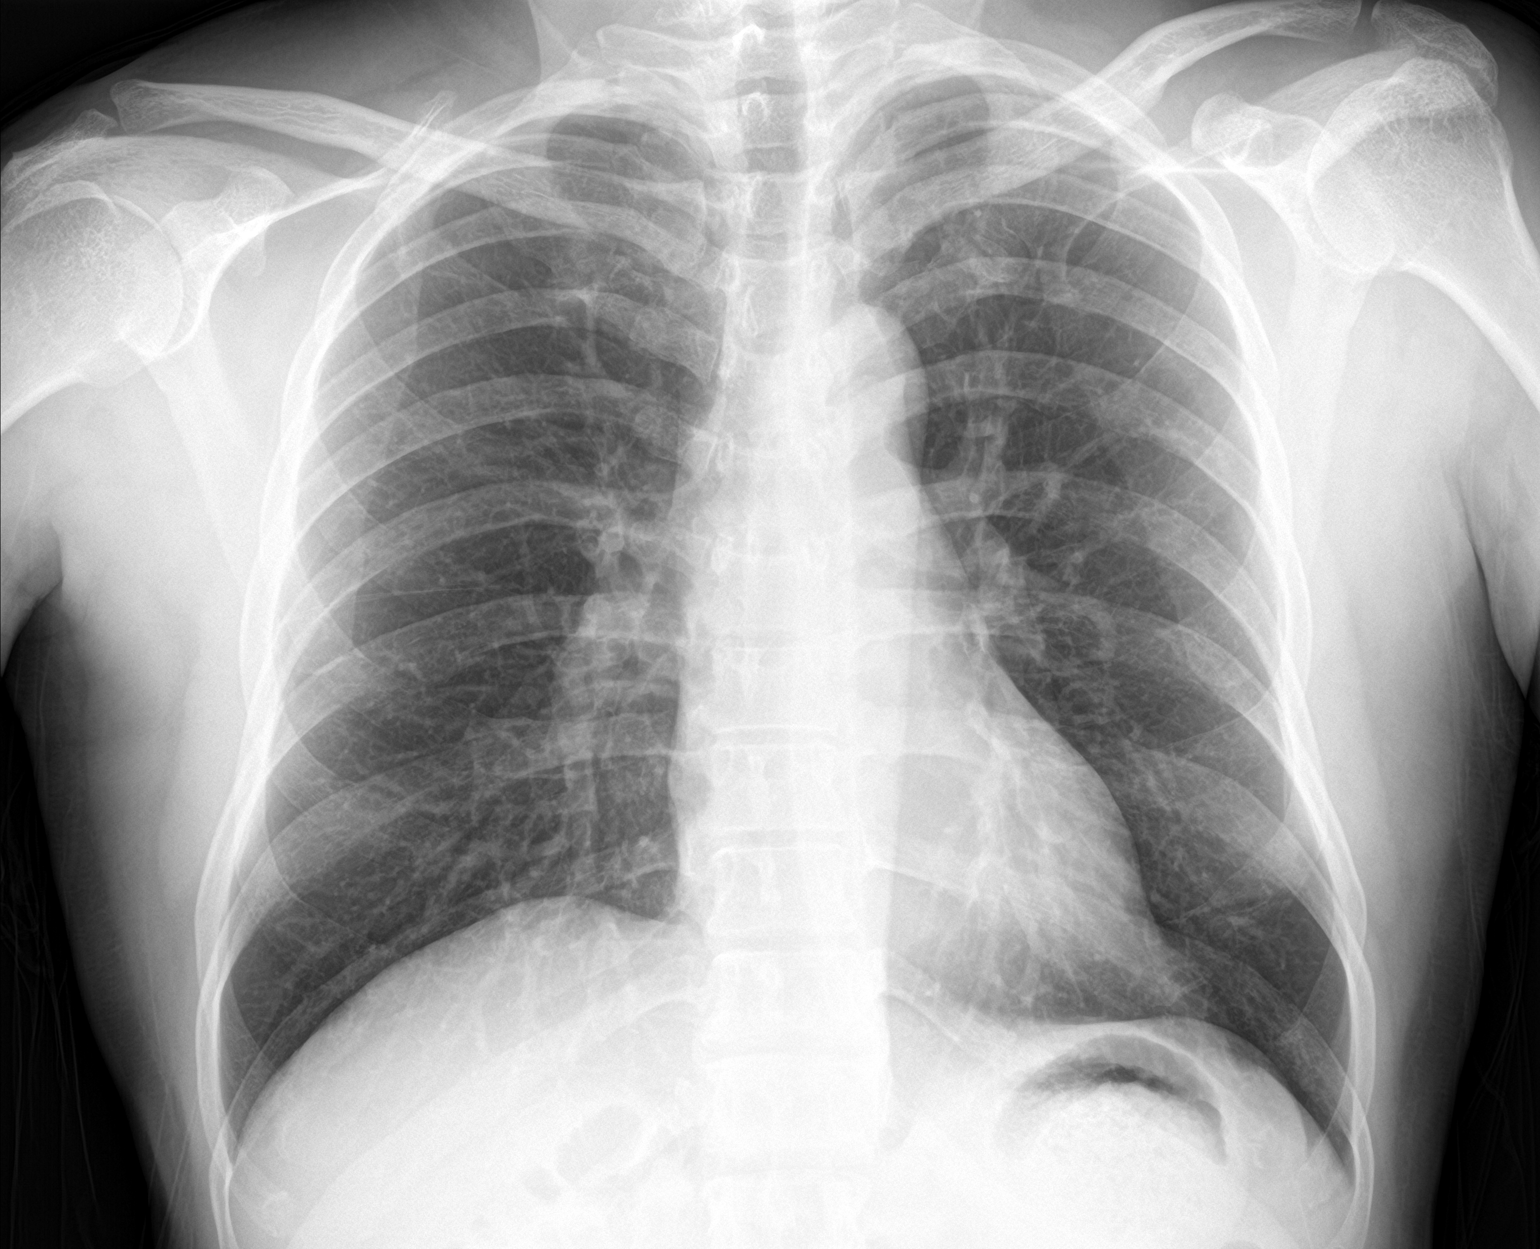

[chest lat]
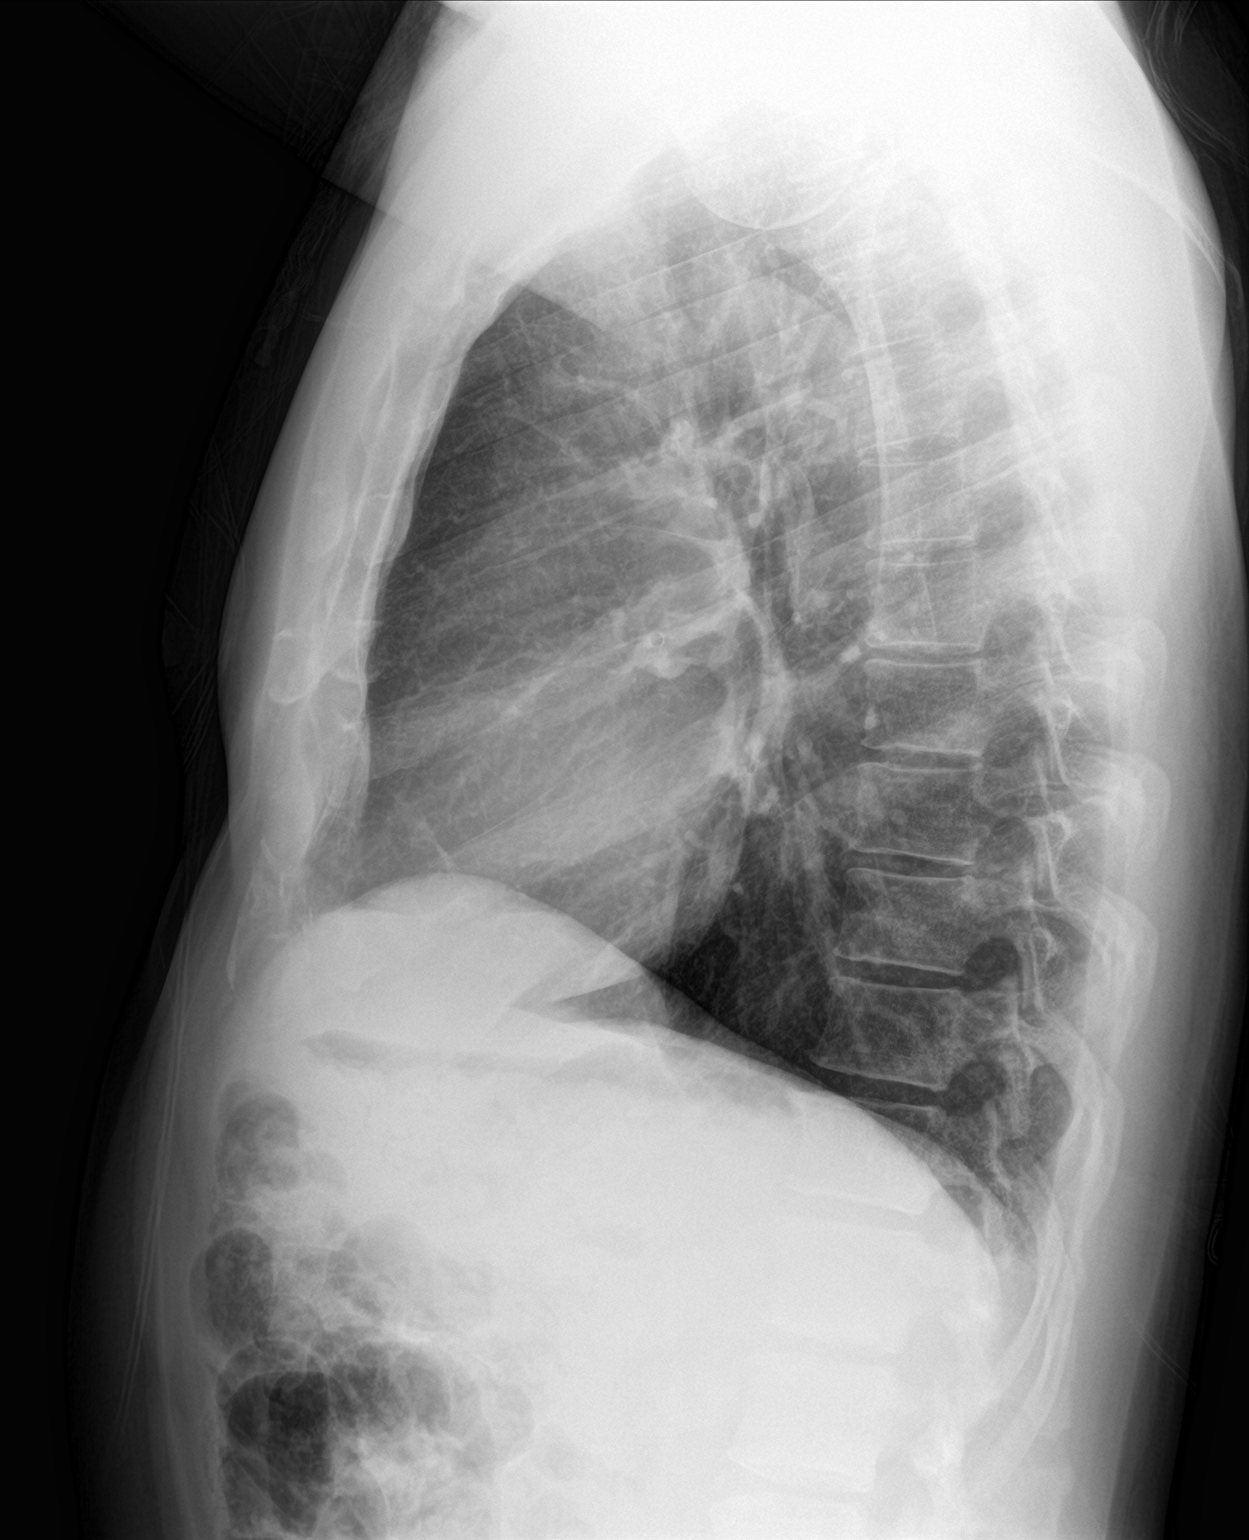

[2 of 2 positions shown; findings below may reference images not displayed]

FINDINGS: Lungs are clear.  No pleural effusion or pneumothorax.

The heart is normal in size.

Visualized osseous structures are within normal limits.
IMPRESSION: Normal chest radiographs.

## 2023-11-09 ENCOUNTER — Encounter (HOSPITAL_COMMUNITY): Payer: Self-pay

## 2023-11-09 ENCOUNTER — Ambulatory Visit (HOSPITAL_COMMUNITY)
Admission: EM | Admit: 2023-11-09 | Discharge: 2023-11-09 | Disposition: A | Discharge status: Home / Self Care | Payer: PRIVATE HEALTH INSURANCE | Attending: Emergency Medicine | Admitting: Emergency Medicine

## 2023-11-09 DIAGNOSIS — J069 Acute upper respiratory infection, unspecified: Secondary | ICD-10-CM | POA: Diagnosis not present

## 2023-11-09 MED ORDER — BENZONATATE 100 MG PO CAPS: 100.0000 mg | ORAL_CAPSULE | Freq: Three times a day (TID) | ORAL | 0 refills | Status: AC

## 2023-11-09 MED ORDER — AZITHROMYCIN 250 MG PO TABS: 250.0000 mg | ORAL_TABLET | Freq: Every day | ORAL | 0 refills | Status: AC

## 2023-11-09 NOTE — ED Triage Notes (Signed)
Interpreter: Darrel Hoover [phone]  Sore throat for 7 days.  Patient having congestion with dark mucus, drainage from the eyes when waking up as well. No fever but a slight cough. No known sick exposure.   Has tried Advil pm with little relief to help sleep.

## 2023-11-09 NOTE — ED Provider Notes (Signed)
[facility]    CSN: [id number] Arrival date & time: 11/09/23  1127      History   Chief Complaint Chief Complaint  Patient presents with   Sore Throat    HPI Marc Wade is a 53 y.o. male.   Patient presents to clinic for complaints of sore throat and a productive cough that have been worsening over the past week.  Productive cough with dark mucus, endorses wheezing and shortness of breath.  He has not had any fever.  No recent sick contacts.  He has tried Advil PM to help him sleep because the coughing gets worse at night.  No nausea, vomiting or diarrhea.  No headache.  No sinus pain or pressure.  The history is provided by the patient and medical records. The history is limited by a language barrier. A language interpreter was used.  Sore Throat    Past Medical History:  Diagnosis Date   Allergy     There are no active problems to display for this patient.   History reviewed. No pertinent surgical history.     Home Medications    Prior to Admission medications   Medication Sig Start Date End Date Taking? Authorizing Provider  azithromycin (ZITHROMAX) 250 MG tablet Take 1 tablet (250 mg total) by mouth daily. Take first 2 tablets together, then 1 every day until finished. 11/09/23  Yes Rinaldo Ratel, [place] N, FNP  benzonatate (TESSALON) 100 MG capsule Take 1 capsule (100 mg total) by mouth every 8 (eight) hours. 11/09/23  Yes Rinaldo Ratel, [place] N, FNP  Multiple Vitamin (MULTIVITAMIN) tablet Take 1 tablet by mouth daily. Reported on 10/20/2015   Yes [provider]    Family History Family History  Problem Relation Age of Onset   Healthy Mother    Healthy Father     Social History Social History   Tobacco Use   Smoking status: Never   Smokeless tobacco: Never  Vaping Use   Vaping status: Never Used  Substance Use Topics   Alcohol use: Yes    Alcohol/week: 0.0 standard drinks of alcohol    Comment: occ   Drug use: No     Allergies    Patient has no known allergies.   Review of Systems Review of Systems  Per HPI   Physical Exam Triage Vital Signs ED Triage Vitals  Encounter Vitals Group     BP 11/09/23 1302 123/82     Systolic BP Percentile --      Diastolic BP Percentile --      Pulse Rate 11/09/23 1302 (!) 58     Resp 11/09/23 1302 16     Temp 11/09/23 1302 97.9 F (36.6 C)     Temp Source 11/09/23 1302 Oral     SpO2 11/09/23 1302 98 %     Weight 11/09/23 1255 156 lb 4.9 oz (70.9 kg)     Height 11/09/23 1255 5\' 8"  (1.727 m)     Head Circumference --      Peak Flow --      Pain Score 11/09/23 1258 7     Pain Loc --      Pain Education --      Exclude from Growth Chart --    No data found.  Updated Vital Signs BP 123/82 (BP Location: Left Arm)   Pulse (!) 58   Temp 97.9 F (36.6 C) (Oral)   Resp 16   Ht 5\' 8"  (1.727 m)   Wt 156 lb  4.9 oz (70.9 kg)   SpO2 98%   BMI 23.77 kg/m   Visual Acuity Right Eye Distance:   Left Eye Distance:   Bilateral Distance:    Right Eye Near:   Left Eye Near:    Bilateral Near:     Physical Exam Vitals and nursing note reviewed.  Constitutional:      Appearance: He is well-developed.  HENT:     Head: Normocephalic and atraumatic.     Right Ear: External ear normal.     Left Ear: External ear normal.     Nose: Congestion and rhinorrhea present.     Mouth/Throat:     Mouth: Mucous membranes are moist.     Pharynx: Uvula midline. Posterior oropharyngeal erythema present.     Tonsils: No tonsillar exudate or tonsillar abscesses. 0 on the right. 0 on the left.  Cardiovascular:     Rate and Rhythm: Normal rate and regular rhythm.     Heart sounds: Normal heart sounds. No murmur heard. Pulmonary:     Effort: Pulmonary effort is normal. No respiratory distress.     Breath sounds: Normal breath sounds.  Skin:    General: Skin is warm and dry.     Capillary Refill: Capillary refill takes less than 2 seconds.  Neurological:     General: No focal  deficit present.     Mental Status: He is alert and oriented to person, place, and time.  Psychiatric:        Mood and Affect: Mood normal.        Behavior: Behavior normal.      UC Treatments / Results  Labs (all labs ordered are listed, but only abnormal results are displayed) Labs Reviewed - No data to display  EKG   Radiology No results found.  Procedures Procedures (including critical care time)  Medications Ordered in UC Medications - No data to display  Initial Impression / Assessment and Plan / UC Course  I have reviewed the triage vital signs and the nursing notes.  Pertinent labs & imaging results that were available during my care of the patient were reviewed by me and considered in my medical decision making (see chart for details).  Vitals and triage reviewed, patient is hemodynamically stable. Lungs vesicular, heart with regular rate and rhythm.  Congestion, rhinorrhea and posterior pharynx erythema present on physical exam.  Uvula midline, tonsils without exudate.  Afebrile.  Will cover for bacterial URI due to prolonged duration of symptoms and no improvement with azithromycin.  Cough management discussed.  Plan of care, follow-up care return precautions given, questions at this time.    Final Clinical Impressions(s) / UC Diagnoses   Final diagnoses:  Acute upper respiratory infection     Discharge Instructions      Fata antibiyotike nkuko byateganijwe. Edgar Frisk Theodis Aguas imiti yinkorora buri masaha 8 kugirango ifashe guhagarika inkorora yawe. Ku muriro uwo ari wo wose, [place] umubiri cyangwa gukonja urashobora guhinduranya hagati ya mg 500 za Tylenol na mg 100 za ibuprofen buri masaha 4-6. Menya neza ko ugumye neza-byibuze byibuze garama 64 zamazi kumunsi. Ibimenyetso bigomba gutera imbere muminsi 5 kugeza 7 iri [place], niba nta terambere cyangwa impinduka zose zisubira mumavuriro kugirango yongere asubiremo.  Take the antibiotics as prescribed.  You  can use the cough medicine every 8 hours to help suppress your cough.  For any fever, body aches or chills you can alternate between 500 mg of Tylenol and 100 mg of ibuprofen every 4-6 hours.  Ensure you  are staying well-hydrated with at least 64 ounces of water daily.  Symptoms should improve over the next 5 to 7 days, if no improvement or any changes return to clinic for reevaluation.      ED Prescriptions     Medication Sig Dispense Auth. Provider   benzonatate (TESSALON) 100 MG capsule Take 1 capsule (100 mg total) by mouth every 8 (eight) hours. 21 capsule Rinaldo Ratel, [place] N, [place]   azithromycin (ZITHROMAX) 250 MG tablet Take 1 tablet (250 mg total) by mouth daily. Take first 2 tablets together, then 1 every day until finished. 6 tablet Donovan Persley, [place] N, [place]      PDMP not reviewed this encounter.   Shanikka Wonders, [place] N, [place] 11/09/23 1330

## 2023-11-09 NOTE — Discharge Instructions (Addendum)
Fata antibiyotike nkuko byateganijwe. Edgar Frisk Theodis Aguas imiti yinkorora buri masaha 8 kugirango ifashe guhagarika inkorora yawe. Ku muriro uwo ari wo wose, [place] umubiri cyangwa gukonja urashobora guhinduranya hagati ya mg 500 za Tylenol na mg 100 za ibuprofen buri masaha 4-6. Menya neza ko ugumye neza-byibuze byibuze garama 64 zamazi kumunsi. Ibimenyetso bigomba gutera imbere muminsi 5 kugeza 7 iri [place], niba nta terambere cyangwa impinduka zose zisubira mumavuriro kugirango yongere asubiremo.  Take the antibiotics as prescribed.  You can use the cough medicine every 8 hours to help suppress your cough.  For any fever, body aches or chills you can alternate between 500 mg of Tylenol and 100 mg of ibuprofen every 4-6 hours.  Ensure you are staying well-hydrated with at least 64 ounces of water daily.  Symptoms should improve over the next 5 to 7 days, if no improvement or any changes return to clinic for reevaluation.

## 2024-03-13 ENCOUNTER — Encounter (HOSPITAL_COMMUNITY): Payer: Self-pay | Admitting: Emergency Medicine

## 2024-03-13 ENCOUNTER — Ambulatory Visit (INDEPENDENT_AMBULATORY_CARE_PROVIDER_SITE_OTHER)

## 2024-03-13 ENCOUNTER — Ambulatory Visit (HOSPITAL_COMMUNITY)
Admission: EM | Admit: 2024-03-13 | Discharge: 2024-03-13 | Disposition: A | Discharge status: Home / Self Care | Attending: Emergency Medicine | Admitting: Emergency Medicine

## 2024-03-13 ENCOUNTER — Other Ambulatory Visit: Payer: Self-pay

## 2024-03-13 DIAGNOSIS — J209 Acute bronchitis, unspecified: Secondary | ICD-10-CM | POA: Diagnosis not present

## 2024-03-13 DIAGNOSIS — R062 Wheezing: Secondary | ICD-10-CM | POA: Diagnosis not present

## 2024-03-13 DIAGNOSIS — R0602 Shortness of breath: Secondary | ICD-10-CM

## 2024-03-13 MED ORDER — ALBUTEROL SULFATE HFA 108 (90 BASE) MCG/ACT IN AERS
2.0000 | INHALATION_SPRAY | Freq: Once | RESPIRATORY_TRACT | Status: AC
  Administered 2024-03-13: 2 via RESPIRATORY_TRACT

## 2024-03-13 MED ORDER — PREDNISONE 20 MG PO TABS: 40.0000 mg | ORAL_TABLET | Freq: Every day | ORAL | 0 refills | Status: AC

## 2024-03-13 MED ORDER — PROMETHAZINE-DM 6.25-15 MG/5ML PO SYRP: 5.0000 mL | ORAL_SOLUTION | Freq: Four times a day (QID) | ORAL | 0 refills | Status: AC | PRN

## 2024-03-13 MED ORDER — ALBUTEROL SULFATE HFA 108 (90 BASE) MCG/ACT IN AERS
INHALATION_SPRAY | RESPIRATORY_TRACT | Status: AC
  Filled 2024-03-13: qty 6.7

## 2024-03-13 NOTE — ED Triage Notes (Signed)
Pt here for cough and congestion x 3 days 

## 2024-03-13 NOTE — Discharge Instructions (Addendum)
 Phim ch?p X-quang c?a b?n khng cho th?y nhi?m trng do vi khu?n, ti nghi ng? b?n b? vim ph? qu?n. ?y l tnh tr?ng vim ???ng h h?p, th??ng do nhi?m vi-rt. S? d?ng bnh x?t m?i 6 gi? khi c?n ?? gip gi?m th? kh kh ho?c kh th?. B?t ??u dng steroid hng ngy cng v?i b?a sng. S? d?ng thu?c ho khi c?n, thu?c ny s? gy bu?n ng? v an th?n, khng li xe khi dng thu?c ny. Cc tri?u ch?ng s? c?i thi?n trong vi ngy t?i, n?u khng c?i thi?n ho?c c b?t k? thay ??i no, hy quay l?i phng khm ?? ?nh gi l?i.  Your x-ray did not show bacterial infection, I suspect you have bronchitis. This is inflammation of the airways, usually caused by a viral infection. Use the inhaler every 6 hours as needed to help with wheezing or shortness of breath. Start the steroids daily with breakfast. Use the cough medicine as needed, this will cause drowsiness and sedation, do not drive on this medicine. Symptoms should improve over the next few days, if no improvement or any changes return to clinic for reevaluation.

## 2024-03-13 NOTE — ED Provider Notes (Signed)
 [facility]    CSN: [id number] Arrival date & time: 03/13/24  1003      History   Chief Complaint Chief Complaint  Patient presents with   Cough    HPI Marc Wade is a 53 y.o. male.   Video [place] interpretor used for this encounter.   Patient presents to clinic over concern of cough and congestion x3 days  Sick for three days, coughing and worse at night  Cough and congestion  Feels like he is wheezing and is short of breath  Phlegm is yellow, fatigued  Lost voice last night and his voice is hoarse  Has been taking Mucinex without improvement  Denies fever or headache  Last year around this time he was sick and went to a doctor    The history is provided by the patient and medical records. The history is limited by a language barrier. A language interpreter was used.  Cough   Past Medical History:  Diagnosis Date   Allergy     There are no active problems to display for this patient.   History reviewed. No pertinent surgical history.     Home Medications    Prior to Admission medications   Medication Sig Start Date End Date Taking? Authorizing Provider  predniSONE  (DELTASONE ) 20 MG tablet Take 2 tablets (40 mg total) by mouth daily with breakfast for 5 days. 03/13/24 03/18/24 Yes Cheyane Ayon  N, FNP  promethazine -dextromethorphan (PROMETHAZINE -DM) 6.25-15 MG/5ML syrup Take 5 mLs by mouth 4 (four) times daily as needed for cough. 03/13/24  Yes Sonakshi Rolland  N, FNP  azithromycin  (ZITHROMAX ) 250 MG tablet Take 1 tablet (250 mg total) by mouth daily. Take first 2 tablets together, then 1 every day until finished. Patient not taking: Reported on 03/13/2024 11/09/23   Harlow Lighter, Maddi Collar  N, FNP  benzonatate  (TESSALON ) 100 MG capsule Take 1 capsule (100 mg total) by mouth every 8 (eight) hours. 11/09/23   Harlow Lighter, Flavia Bruss  N, FNP  Multiple Vitamin (MULTIVITAMIN) tablet Take 1 tablet by mouth daily. Reported on 10/20/2015    [provider]     Family History Family History  Problem Relation Age of Onset   Healthy Mother    Healthy Father     Social History Social History   Tobacco Use   Smoking status: Never   Smokeless tobacco: Never  Vaping Use   Vaping status: Never Used  Substance Use Topics   Alcohol use: Yes    Alcohol/week: 0.0 standard drinks of alcohol    Comment: occ   Drug use: No     Allergies   Patient has no known allergies.   Review of Systems Review of Systems  Per HPI  Physical Exam Triage Vital Signs ED Triage Vitals  Encounter Vitals Group     BP 03/13/24 1017 120/74     Systolic BP Percentile --      Diastolic BP Percentile --      Pulse Rate 03/13/24 1017 76     Resp 03/13/24 1017 18     Temp 03/13/24 1017 97.9 F (36.6 C)     Temp Source 03/13/24 1017 Oral     SpO2 03/13/24 1017 98 %     Weight --      Height --      Head Circumference --      Peak Flow --      Pain Score 03/13/24 1018 0     Pain Loc --      Pain Education --  Exclude from Growth Chart --    No data found.  Updated Vital Signs BP 120/74 (BP Location: Left Arm)   Pulse 76   Temp 97.9 F (36.6 C) (Oral)   Resp 18   SpO2 98%   Visual Acuity Right Eye Distance:   Left Eye Distance:   Bilateral Distance:    Right Eye Near:   Left Eye Near:    Bilateral Near:     Physical Exam Vitals and nursing note reviewed.  Constitutional:      Appearance: Normal appearance.  HENT:     Head: Normocephalic and atraumatic.     Right Ear: External ear normal.     Left Ear: External ear normal.     Nose: Nose normal.     Mouth/Throat:     Mouth: Mucous membranes are moist.  Eyes:     Conjunctiva/sclera: Conjunctivae normal.  Cardiovascular:     Rate and Rhythm: Normal rate and regular rhythm.     Heart sounds: Normal heart sounds. No murmur heard. Pulmonary:     Effort: Pulmonary effort is normal.     Breath sounds: Wheezing present.  Skin:    General: Skin is warm and dry.   Neurological:     General: No focal deficit present.     Mental Status: He is alert.  Psychiatric:        Mood and Affect: Mood normal.      UC Treatments / Results  Labs (all labs ordered are listed, but only abnormal results are displayed) Labs Reviewed - No data to display  EKG   Radiology DG Chest 2 View Result Date: 03/13/2024 CLINICAL DATA:  cough, shortness of breath EXAM: CHEST - 2 VIEW COMPARISON:  Chest x-ray 09/26/2021, CT chest 08/05/2020 FINDINGS: The heart and mediastinal contours are unchanged. No focal consolidation. No pulmonary edema. No pleural effusion. No pneumothorax. No acute osseous abnormality. IMPRESSION: No active cardiopulmonary disease. Electronically Signed   By: Morgane  Naveau M.D.   On: 03/13/2024 10:46    Procedures Procedures (including critical care time)  Medications Ordered in UC Medications  albuterol  (VENTOLIN  HFA) 108 (90 Base) MCG/ACT inhaler 2 puff (2 puffs Inhalation Given 03/13/24 1045)    Initial Impression / Assessment and Plan / UC Course  I have reviewed the triage vital signs and the nursing notes.  Pertinent labs & imaging results that were available during my care of the patient were reviewed by me and considered in my medical decision making (see chart for details).  Vitals and triage reviewed, patient is hemodynamically stable.  Heart with regular rate and rhythm, lungs with slight expiratory wheezing that clears with coughing.  Chest x-ray obtained due to wheezing and shortness of breath.  By my interpretation no acute cardiopulmonary abnormalities, radiology overread agreeable.  Wheezing and shortness of breath improved with albuterol  inhaler in clinic.  Suspect acute bronchitis.  Will treat with steroids, albuterol  inhaler as needed and cough management.   Plan of care, follow-up care return precautions given, no questions at this time.  Clinical Course as of 03/13/24 1433  Sat Mar 13, 2024  1053 DG Chest 2 View [GG]     Clinical Course User Index [GG] Harlow Lighter, Malayah Demuro  N, FNP   Final Clinical Impressions(s) / UC Diagnoses   Final diagnoses:  Wheezing  Shortness of breath  Acute bronchitis, unspecified organism     Discharge Instructions      Phim ch?p X-quang c?a b?n khng cho th?y nhi?m trng do vi khu?n,  ti nghi ng? b?n b? vim ph? qu?n. ?y l tnh tr?ng vim ???ng h h?p, th??ng do nhi?m vi-rt. S? d?ng bnh x?t m?i 6 gi? khi c?n ?? gip gi?m th? kh kh ho?c kh th?. B?t ??u dng steroid hng ngy cng v?i b?a sng. S? d?ng thu?c ho khi c?n, thu?c ny s? gy bu?n ng? v an th?n, khng li xe khi dng thu?c ny. Cc tri?u ch?ng s? c?i thi?n trong vi ngy t?i, n?u khng c?i thi?n ho?c c b?t k? thay ??i no, hy quay l?i phng khm ?? ?nh gi l?i.  Your x-ray did not show bacterial infection, I suspect you have bronchitis. This is inflammation of the airways, usually caused by a viral infection. Use the inhaler every 6 hours as needed to help with wheezing or shortness of breath. Start the steroids daily with breakfast. Use the cough medicine as needed, this will cause drowsiness and sedation, do not drive on this medicine. Symptoms should improve over the next few days, if no improvement or any changes return to clinic for reevaluation.    ED Prescriptions     Medication Sig Dispense Auth. Provider   promethazine -dextromethorphan (PROMETHAZINE -DM) 6.25-15 MG/5ML syrup Take 5 mLs by mouth 4 (four) times daily as needed for cough. 118 mL Harlow Lighter, Keimani Laufer  N, FNP   predniSONE  (DELTASONE ) 20 MG tablet Take 2 tablets (40 mg total) by mouth daily with breakfast for 5 days. 10 tablet Harlow Lighter, Alonso Gapinski  N, FNP      PDMP not reviewed this encounter.   Harlow Lighter, Curt Oatis  N, FNP 03/13/24 1433
